# Patient Record
Sex: Male | Born: 1970 | Race: White | Hispanic: No | Marital: Married | Smoking: Never smoker
Health system: Southern US, Community
[De-identification: ages and names within clinical notes are randomized; demographics above are authoritative.]

## PROBLEM LIST (undated history)

## (undated) DIAGNOSIS — K649 Unspecified hemorrhoids: Secondary | ICD-10-CM

## (undated) DIAGNOSIS — N400 Enlarged prostate without lower urinary tract symptoms: Secondary | ICD-10-CM

## (undated) DIAGNOSIS — R3911 Hesitancy of micturition: Secondary | ICD-10-CM

## (undated) DIAGNOSIS — E291 Testicular hypofunction: Secondary | ICD-10-CM

## (undated) DIAGNOSIS — E785 Hyperlipidemia, unspecified: Secondary | ICD-10-CM

## (undated) DIAGNOSIS — N529 Male erectile dysfunction, unspecified: Secondary | ICD-10-CM

## (undated) HISTORY — DX: Benign prostatic hyperplasia without lower urinary tract symptoms: N40.0

## (undated) HISTORY — DX: Unspecified hemorrhoids: K64.9

## (undated) HISTORY — DX: Male erectile dysfunction, unspecified: N52.9

## (undated) HISTORY — DX: Testicular hypofunction: E29.1

## (undated) HISTORY — DX: Hyperlipidemia, unspecified: E78.5

## (undated) HISTORY — DX: Hesitancy of micturition: R39.11

---

## 1992-09-07 HISTORY — PX: SHOULDER SURGERY: SHX246

## 2003-09-08 HISTORY — PX: OTHER SURGICAL HISTORY: SHX169

## 2010-11-18 ENCOUNTER — Ambulatory Visit: Payer: Self-pay | Admitting: Internal Medicine

## 2012-02-16 ENCOUNTER — Ambulatory Visit: Payer: Self-pay | Admitting: Unknown Physician Specialty

## 2012-03-18 ENCOUNTER — Ambulatory Visit: Payer: Self-pay | Admitting: Unknown Physician Specialty

## 2015-03-12 HISTORY — PX: OTHER SURGICAL HISTORY: SHX169

## 2015-03-18 ENCOUNTER — Encounter: Payer: Self-pay | Admitting: *Deleted

## 2015-03-18 ENCOUNTER — Ambulatory Visit: Payer: Self-pay | Admitting: Urology

## 2015-03-25 ENCOUNTER — Ambulatory Visit: Payer: Self-pay | Admitting: Urology

## 2015-04-09 ENCOUNTER — Encounter: Payer: Self-pay | Admitting: Urology

## 2015-04-09 ENCOUNTER — Ambulatory Visit (INDEPENDENT_AMBULATORY_CARE_PROVIDER_SITE_OTHER): Payer: 59 | Admitting: Urology

## 2015-04-09 VITALS — BP 122/82 | HR 81 | Ht 71.0 in | Wt 213.6 lb

## 2015-04-09 DIAGNOSIS — N401 Enlarged prostate with lower urinary tract symptoms: Secondary | ICD-10-CM

## 2015-04-09 DIAGNOSIS — N528 Other male erectile dysfunction: Secondary | ICD-10-CM | POA: Diagnosis not present

## 2015-04-09 DIAGNOSIS — E291 Testicular hypofunction: Secondary | ICD-10-CM

## 2015-04-09 DIAGNOSIS — N138 Other obstructive and reflux uropathy: Secondary | ICD-10-CM

## 2015-04-09 LAB — BLADDER SCAN AMB NON-IMAGING: Scan Result: 103

## 2015-04-09 MED ORDER — TESTOSTERONE 30 MG/ACT TD SOLN: 2.0000 | Freq: Every day | TRANSDERMAL | Status: DC

## 2015-04-09 NOTE — Progress Notes (Signed)
04/09/2015 4:36 PM   Jonny Ruiz Jolyn Lent April 29, 1971 [id number]  Referring provider: No referring provider defined for this encounter.  Chief Complaint  Patient presents with  . Benign Prostatic Hypertrophy    6 month recheck  . Erectile Dysfunction    HPI: Mr. Pompey is a 44 year old white male with a history of hypogonadism, erectile dysfunction and BPH with LUTS.  BPH with LUTS His IPSS score today is 1, which is mild lower urinary tract symptomatology. He is pleased  with his quality life due to his urinary symptoms. His PVR is 103 mL.    He does not having any urinary problems at this time.  He denies any dysuria, hematuria or suprapubic pain.   Previous PSA's:     0.3 ng/mL on 08/16/2013     0.4 ng/mL on 02/27/2014     0.3 ng/mL on 09/17/2014  He also denies any recent fevers, chills, nausea or vomiting.  He has a family history of PCa, with maternal grandfather with prostate cancer.         IPSS      04/09/15 1600       International Prostate Symptom Score   How often have you had the sensation of not emptying your bladder? Not at All     How often have you had to urinate less than every two hours? Not at All     How often have you found you stopped and started again several times when you urinated? Not at All     How often have you found it difficult to postpone urination? Less than 1 in 5 times     How often have you had a weak urinary stream? Not at All     How often have you had to strain to start urination? Not at All     How many times did you typically get up at night to urinate? None     Total IPSS Score 1     Quality of Life due to urinary symptoms   If you were to spend the rest of your life with your urinary condition just the way it is now how would you feel about that? Pleased        Score:  1-7 Mild 8-19 Moderate 20-35 Severe  Erectile dysfunction: His SHIM score is 20, which is mild ED.   He has been having difficulty with erections for three  years.   His major complaint is maintaning his erections.  His libido is good.   His risk factors for ED are BPH.  He denies any painful erections or curvatures with his erections.   He has tried Cialis and Viagra with good results.       SHIM      04/09/15 1626       SHIM: Over the last 6 months:   How do you rate your confidence that you could get and keep an erection? Low     When you had erections with sexual stimulation, how often were your erections hard enough for penetration (entering your partner)? Most Times (much more than half the time)     During sexual intercourse, how often were you able to maintain your erection after you had penetrated (entered) your partner? Slightly Difficult     During sexual intercourse, how difficult was it to maintain your erection to completion of intercourse? Not Difficult     When you attempted sexual intercourse, how often was it satisfactory for you?  Not Difficult     SHIM Total Score   SHIM 20        Score: 1-7 Severe ED 8-11 Moderate ED 12-16 Mild-Moderate ED 17-21 Mild ED 22-25 No ED  Hypogonadism: Patient was diagnosed with hypogonadism in 2013. His serum testosterone level was 194 ng/dL on 16/06/9603.  He was started on Axiron at that time and has continue that medication with good results.  He has not had the Axiron medication for the last 2 weeks.  He had suffered a work related injury and had to reschedule his appointment. He would like to get back on the medication.      Androgen Deficiency in the Aging Male      04/09/15 1600       Androgen Deficiency in the Aging Male   Do you have a decrease in libido (sex drive) Yes     Do you have lack of energy Yes     Do you have a decrease in strength and/or endurance Yes     Have you lost height No     Have you noticed a decreased "enjoyment of life" No     Are you sad and/or grumpy No     Are your erections less strong Yes     Have you noticed a recent deterioration in your  ability to play sports No     Are you falling asleep after dinner No     Has there been a recent deterioration in your work performance Yes            PMH: Past Medical History  Diagnosis Date  . Erectile dysfunction   . HLD (hyperlipidemia)   . Hemorrhoids   . Hypogonadism in male   . Erectile dysfunction   . Benign enlargement of prostate   . Erectile dysfunction   . Urinary hesitancy     Surgical History: Past Surgical History  Procedure Laterality Date  . Leg and ankle fracture  2005  . Shoulder surgery Right 1994  . Bicep surgery Right 03/12/2015    Home Medications:    Medication List       This list is accurate as of: 04/09/15  4:36 PM.  Always use your most recent med list.               diazepam 5 MG tablet  Commonly known as:  VALIUM  Take 5 mg by mouth.     gabapentin 100 MG capsule  Commonly known as:  NEURONTIN  Take 100 mg by mouth.     oxyCODONE 5 MG immediate release tablet  Commonly known as:  Oxy IR/ROXICODONE  Take 5-10 mg by mouth.     PERCOCET 5-325 MG per tablet  Generic drug:  oxyCODONE-acetaminophen  1-2 tabs PO q6hr prn     sildenafil 100 MG tablet  Commonly known as:  VIAGRA  Take 100 mg by mouth as needed for erectile dysfunction.     Testosterone 30 MG/ACT Soln  Commonly known as:  AXIRON  Place 2 sprays onto the skin daily.     traMADol 50 MG tablet  Commonly known as:  ULTRAM  Take 50 mg by mouth.     ZOFRAN ODT 4 MG disintegrating tablet  Generic drug:  ondansetron  Take 4 mg by mouth.        Allergies:  Allergies  Allergen Reactions  . Hydrocodone-Acetaminophen Anxiety    "crazy"    Family History: Family History  Problem Relation Age of  Onset  . Prostate cancer Maternal Grandfather   . Diabetes Mellitus II    . Hypertension    . Kidney disease Neg Hx   . Prostate cancer      x6 Great uncles mom side    Social History:  reports that he has never smoked. He does not have any smokeless tobacco  history on file. He reports that he does not drink alcohol or use illicit drugs.  ROS: UROLOGY Frequent Urination?: No Hard to postpone urination?: No Burning/pain with urination?: No Get up at night to urinate?: No Leakage of urine?: No Urine stream starts and stops?: No Trouble starting stream?: No Do you have to strain to urinate?: No Blood in urine?: No Urinary tract infection?: No Sexually transmitted disease?: No Injury to kidneys or bladder?: No Painful intercourse?: No Weak stream?: No Erection problems?: No Penile pain?: No  Gastrointestinal Nausea?: No Vomiting?: No Indigestion/heartburn?: No Diarrhea?: No Constipation?: No  Constitutional Fever: No Night sweats?: No Weight loss?: No Fatigue?: No  Skin Skin rash/lesions?: No Itching?: No  Eyes Blurred vision?: No Double vision?: No  Ears/Nose/Throat Sore throat?: No Sinus problems?: No  Hematologic/Lymphatic Swollen glands?: No Easy bruising?: No  Cardiovascular Leg swelling?: No Chest pain?: No  Respiratory Cough?: No Shortness of breath?: No  Endocrine Excessive thirst?: No  Musculoskeletal Back pain?: No Joint pain?: No  Neurological Headaches?: No Dizziness?: No  Psychologic Depression?: No Anxiety?: No  Physical Exam: BP 122/82 mmHg  Pulse 81  Ht 5\' 11"  (1.803 m)  Wt 213 lb 9.6 oz (96.888 kg)  BMI 29.80 kg/m2  GU: Patient with circumcised phallus.  Urethral meatus is patent.  No penile discharge. No penile lesions or rashes. Scrotum without lesions, cysts, rashes and/or edema.  Testicles are located scrotally bilaterally. No masses are appreciated in the testicles. Left and right epididymis are normal. Rectal: Patient with  normal sphincter tone. Perineum without scarring or rashes. No rectal masses are appreciated. Prostate is approximately 45 grams, no nodules are appreciated. Seminal vesicles are normal.   Laboratory Data: Results for orders placed or performed in  visit on 04/09/15  PSA  Result Value Ref Range   Prostate Specific Ag, Serum 0.4 0.0 - 4.0 ng/mL  Hematocrit  Result Value Ref Range   Hematocrit 44.7 37.5 - 51.0 %  BLADDER SCAN AMB NON-IMAGING  Result Value Ref Range   Scan Result 103    No results found for: WBC, HGB, HCT, MCV, PLT  No results found for: CREATININE  No results found for: PSA  No results found for: TESTOSTERONE  No results found for: HGBA1C  Urinalysis No results found for: COLORURINE, APPEARANCEUR, LABSPEC, PHURINE, GLUCOSEU, HGBUR, BILIRUBINUR, KETONESUR, PROTEINUR, UROBILINOGEN, NITRITE, LEUKOCYTESUR  Pertinent Imaging:   Assessment & Plan:    1. BPH (benign prostatic hyperplasia) with LUTS:  Patient's IPSS score is 1/1.  His PVR 103.  His DRE demonstrates mild enlargement.  I discussed the treatment options for BPH, such as: observation over time with prn avoidance of alcohol/caffeine; medical treatment or TURP.  Patient like to continue observation.  He will follow up in 6 months for a PSA, DRE, PVR and an IPSS.    - PSA - BLADDER SCAN AMB NON-IMAGING  2. Hypogonadism in male:   Patient will return in the morning for his serum testosterone draw. A refill for the Axiron is sent to his pharmacy. He will follow-up in 6 months time for hematocrit, a serum testosterone draw before 9 AM and a  ADAM questionnaire  - Testosterone; Future - Hematocrit  3. Erectile dysfunction:  SHIM score is 20.  He states he is still using Viagra on a as needed basis.  He does not require a refill at this time.  He will follow up in six months for a SHIM.   Michiel Cowboy, PA-C  [facility] [address], [address] [place], [place] [address] [phone]

## 2015-04-10 ENCOUNTER — Other Ambulatory Visit: Payer: 59

## 2015-04-10 DIAGNOSIS — N138 Other obstructive and reflux uropathy: Secondary | ICD-10-CM | POA: Insufficient documentation

## 2015-04-10 DIAGNOSIS — E291 Testicular hypofunction: Secondary | ICD-10-CM

## 2015-04-10 DIAGNOSIS — N528 Other male erectile dysfunction: Secondary | ICD-10-CM | POA: Insufficient documentation

## 2015-04-10 DIAGNOSIS — N401 Enlarged prostate with lower urinary tract symptoms: Principal | ICD-10-CM

## 2015-04-10 LAB — PSA: Prostate Specific Ag, Serum: 0.4 ng/mL (ref 0.0–4.0)

## 2015-04-10 LAB — HEMATOCRIT: HEMATOCRIT: 44.7 % (ref 37.5–51.0)

## 2015-04-11 ENCOUNTER — Telehealth: Payer: Self-pay

## 2015-04-11 LAB — TESTOSTERONE: Testosterone: 295 ng/dL — ABNORMAL LOW (ref 348–1197)

## 2015-04-11 NOTE — Telephone Encounter (Signed)
Spoke with pt and made aware of lab results. Pt voiced understanding.  

## 2015-04-11 NOTE — Telephone Encounter (Signed)
-----   Message from Harle Battiest, PA-C sent at 04/11/2015 12:51 PM EDT ----- Patient's labs are good for the exception of his testosterone.  We expect that because he has been off his testosterone.

## 2015-10-10 ENCOUNTER — Ambulatory Visit: Payer: 59 | Admitting: Urology

## 2015-10-11 ENCOUNTER — Ambulatory Visit (INDEPENDENT_AMBULATORY_CARE_PROVIDER_SITE_OTHER): Payer: 59 | Admitting: Urology

## 2015-10-11 ENCOUNTER — Encounter: Payer: Self-pay | Admitting: Urology

## 2015-10-11 VITALS — BP 126/77 | HR 90 | Ht 71.0 in | Wt 208.3 lb

## 2015-10-11 DIAGNOSIS — N528 Other male erectile dysfunction: Secondary | ICD-10-CM

## 2015-10-11 DIAGNOSIS — E291 Testicular hypofunction: Secondary | ICD-10-CM | POA: Diagnosis not present

## 2015-10-11 DIAGNOSIS — N138 Other obstructive and reflux uropathy: Secondary | ICD-10-CM

## 2015-10-11 DIAGNOSIS — N401 Enlarged prostate with lower urinary tract symptoms: Secondary | ICD-10-CM | POA: Diagnosis not present

## 2015-10-11 MED ORDER — NATESTO 5.5 MG/ACT NA GEL: 1.0000 | Freq: Two times a day (BID) | NASAL | Status: DC

## 2015-10-11 NOTE — Progress Notes (Signed)
8:48 AM   Carla Drape Oct 27, 1970 [id number]  Referring provider: Marguarite Arbour, MD [address] [facility] [place], [place] [address]  Chief Complaint  Patient presents with  . Benign Prostatic Hypertrophy    6 month follow up  . Hypogonadism    HPI: [name] is a 45 year old white male with a history of hypogonadism, erectile dysfunction and BPH with LUTS for a 6 months follow up.   BPH with LUTS His IPSS score today is 0, which is no lower urinary tract symptomatology. He is delighted  with his quality life due to his urinary symptoms.  He does not having any urinary problems at this time.  He denies any dysuria, hematuria or suprapubic pain.  He also denies any recent fevers, chills, nausea or vomiting.  He has a family history of PCa, with maternal grandfather with prostate cancer.         IPSS      10/11/15 0800       International Prostate Symptom Score   How often have you had the sensation of not emptying your bladder? Not at All     How often have you had to urinate less than every two hours? Not at All     How often have you found you stopped and started again several times when you urinated? Not at All     How often have you found it difficult to postpone urination? Not at All     How often have you had a weak urinary stream? Not at All     How often have you had to strain to start urination? Not at All     How many times did you typically get up at night to urinate? None     Total IPSS Score 0     Quality of Life due to urinary symptoms   If you were to spend the rest of your life with your urinary condition just the way it is now how would you feel about that? Delighted        Score:  1-7 Mild 8-19 Moderate 20-35 Severe  Erectile dysfunction: His SHIM score is 22, which is no ED.   He has been having difficulty with erections for three years.   His major complaint is maintaining his erections.  His libido is good.   His risk factors  for ED are BPH.  He denies any painful erections or curvatures with his erections.   He has tried Cialis and Viagra with good results.       SHIM      10/11/15 0836       SHIM: Over the last 6 months:   How do you rate your confidence that you could get and keep an erection? High     When you had erections with sexual stimulation, how often were your erections hard enough for penetration (entering your partner)? Most Times (much more than half the time)     During sexual intercourse, how often were you able to maintain your erection after you had penetrated (entered) your partner? Slightly Difficult     During sexual intercourse, how difficult was it to maintain your erection to completion of intercourse? Not Difficult     When you attempted sexual intercourse, how often was it satisfactory for you? Not Difficult     SHIM Total Score   SHIM 22        Score: 1-7 Severe ED  8-11 Moderate ED 12-16 Mild-Moderate ED 17-21 Mild ED 22-25 No ED  Hypogonadism: Patient was diagnosed with hypogonadism in 2013. His serum testosterone level was 194 ng/dL on 16/06/9603.  He was started on Axiron at that time and has continue that medication with good results.  He feels well for the exception of his erections being less strong than when he was younger.       Androgen Deficiency in the Aging Male      10/11/15 0800       Androgen Deficiency in the Aging Male   Do you have a decrease in libido (sex drive) No     Do you have lack of energy No     Do you have a decrease in strength and/or endurance No     Have you lost height No     Have you noticed a decreased "enjoyment of life" No     Are you sad and/or grumpy No     Are your erections less strong Yes     Have you noticed a recent deterioration in your ability to play sports No     Are you falling asleep after dinner No     Has there been a recent deterioration in your work performance No            PMH: Past Medical History    Diagnosis Date  . Erectile dysfunction   . HLD (hyperlipidemia)   . Hemorrhoids   . Hypogonadism in male   . Erectile dysfunction   . Benign enlargement of prostate   . Erectile dysfunction   . Urinary hesitancy     Surgical History: Past Surgical History  Procedure Laterality Date  . Leg and ankle fracture  2005  . Shoulder surgery Right 1994  . Bicep surgery Right 03/12/2015    Home Medications:    Medication List       This list is accurate as of: 10/11/15  8:48 AM.  Always use your most recent med list.               gabapentin 100 MG capsule  Commonly known as:  NEURONTIN  Take 100 mg by mouth. Reported on 10/11/2015     ibuprofen 600 MG tablet  Commonly known as:  ADVIL,MOTRIN  Take 600 mg by mouth.     LYRICA 75 MG capsule  Generic drug:  pregabalin  Take 75 mg by mouth. Reported on 10/11/2015     oxyCODONE 5 MG immediate release tablet  Commonly known as:  Oxy IR/ROXICODONE  Take 5-10 mg by mouth. Reported on 10/11/2015     PERCOCET 5-325 MG tablet  Generic drug:  oxyCODONE-acetaminophen  Reported on 10/11/2015     sildenafil 100 MG tablet  Commonly known as:  VIAGRA  Take 100 mg by mouth as needed for erectile dysfunction. Reported on 10/11/2015     Testosterone 30 MG/ACT Soln  Commonly known as:  AXIRON  Place 2 sprays onto the skin daily.     traMADol 50 MG tablet  Commonly known as:  ULTRAM  Take 50 mg by mouth. Reported on 10/11/2015     ZOFRAN ODT 4 MG disintegrating tablet  Generic drug:  ondansetron  Take 4 mg by mouth. Reported on 10/11/2015        Allergies:  Allergies  Allergen Reactions  . Hydrocodone-Acetaminophen Anxiety    "crazy"    Family History: Family History  Problem Relation Age of Onset  . Prostate cancer Maternal  Grandfather   . Diabetes Mellitus II    . Hypertension    . Kidney disease Neg Hx   . Prostate cancer      x6 Great uncles mom side    Social History:  reports that he has never smoked. He does not have  any smokeless tobacco history on file. He reports that he does not drink alcohol or use illicit drugs.  ROS: UROLOGY Frequent Urination?: No Hard to postpone urination?: No Burning/pain with urination?: No Get up at night to urinate?: No Leakage of urine?: No Urine stream starts and stops?: No Trouble starting stream?: No Do you have to strain to urinate?: No Blood in urine?: No Urinary tract infection?: No Sexually transmitted disease?: No Injury to kidneys or bladder?: No Painful intercourse?: No Weak stream?: No Erection problems?: No Penile pain?: No  Gastrointestinal Nausea?: No Vomiting?: No Indigestion/heartburn?: No Diarrhea?: No Constipation?: No  Constitutional Fever: No Night sweats?: No Weight loss?: No Fatigue?: No  Skin Skin rash/lesions?: No Itching?: No  Eyes Blurred vision?: No Double vision?: No  Ears/Nose/Throat Sore throat?: No Sinus problems?: No  Hematologic/Lymphatic Swollen glands?: No Easy bruising?: No  Cardiovascular Leg swelling?: No Chest pain?: No  Respiratory Cough?: No Shortness of breath?: No  Endocrine Excessive thirst?: No  Musculoskeletal Back pain?: No Joint pain?: No  Neurological Headaches?: No Dizziness?: No  Psychologic Depression?: No Anxiety?: No  Physical Exam: BP 126/77 mmHg  Pulse 90  Ht  (1.803 m)  Wt 208 lb 4.8 oz (94.484 kg)  BMI 29.06 kg/m2  GU: Patient with circumcised phallus.  Urethral meatus is patent.  No penile discharge. No penile lesions or rashes. Scrotum without lesions, cysts, rashes and/or edema.  Testicles are located scrotally bilaterally. No masses are appreciated in the testicles. Left and right epididymis are normal. Rectal: Patient with  normal sphincter tone. Perineum without scarring or rashes. No rectal masses are appreciated. Prostate is approximately 45 grams, no nodules are appreciated. Seminal vesicles are normal.   Laboratory Data: Results for orders  placed or performed in visit on 04/10/15  Testosterone  Result Value Ref Range   Testosterone 295 (L) 348 - 1197 ng/dL   Comment, Testosterone Comment    Lab Results  Component Value Date   HCT 44.7 04/09/2015   Previous PSA's:     0.3 ng/mL on 08/16/2013     0.4 ng/mL on 02/27/2014     0.3 ng/mL on 09/17/2014  Lab Results  Component Value Date   TESTOSTERONE 295* 04/10/2015    Pertinent Imaging:   Assessment & Plan:    1. BPH (benign prostatic hyperplasia) with LUTS:  Patient's IPSS score is 0/0.   He will follow up in 6 months for a PSA, exam and an IPSS score.    - PSA   2. Hypogonadism in male:   Patient is having good results with the Axiron, but he is wanting to know if there is another option besides gels.  I suggested Natesto.  He would like to see if his insurance would cover that medication.  I have given him a script.  He will RTC in 3 months for a HCT and testosterone level.    - Testosterone; Future - Hematocrit -TSH -Hepatic panel -lipid panel  3. Erectile dysfunction:  SHIM score is 22.  He states he is still using Viagra on a as needed basis.  He does not require a refill at this time.  He will follow up in six months  for a SHIM and exam.    Michiel Cowboy, [facility] [address] [place], [place] [address] [phone]

## 2015-10-12 LAB — HEPATIC FUNCTION PANEL
ALT: 70 IU/L — AB (ref 0–44)
AST: 35 IU/L (ref 0–40)
Albumin: 4.4 g/dL (ref 3.5–5.5)
Alkaline Phosphatase: 53 IU/L (ref 39–117)
Bilirubin Total: 1.3 mg/dL — ABNORMAL HIGH (ref 0.0–1.2)
Bilirubin, Direct: 0.24 mg/dL (ref 0.00–0.40)
Total Protein: 6.6 g/dL (ref 6.0–8.5)

## 2015-10-12 LAB — TSH: TSH: 1.25 u[IU]/mL (ref 0.450–4.500)

## 2015-10-12 LAB — LIPID PANEL
CHOLESTEROL TOTAL: 215 mg/dL — AB (ref 100–199)
Chol/HDL Ratio: 6.5 ratio units — ABNORMAL HIGH (ref 0.0–5.0)
HDL: 33 mg/dL — AB (ref >39)
LDL Calculated: 131 mg/dL — ABNORMAL HIGH (ref 0–99)
TRIGLYCERIDES: 254 mg/dL — AB (ref 0–149)
VLDL CHOLESTEROL CAL: 51 mg/dL — AB (ref 5–40)

## 2015-10-12 LAB — ESTRADIOL: ESTRADIOL: 25.6 pg/mL (ref 7.6–42.6)

## 2015-10-12 LAB — TESTOSTERONE: Testosterone: 688 ng/dL (ref 348–1197)

## 2015-10-12 LAB — HEMATOCRIT: Hematocrit: 46.4 % (ref 37.5–51.0)

## 2015-10-12 LAB — PSA: PROSTATE SPECIFIC AG, SERUM: 0.5 ng/mL (ref 0.0–4.0)

## 2015-10-14 ENCOUNTER — Telehealth: Payer: Self-pay

## 2015-10-14 NOTE — Telephone Encounter (Signed)
Spoke with pt in reference to abnormal labs. Made aware Carollee Herter suggest he see his PCP. Pt voiced understanding.

## 2015-10-14 NOTE — Telephone Encounter (Signed)
-----   Message from Harle Battiest, PA-C sent at 10/12/2015  7:44 PM EST ----- Patient's liver enzymes and lipids are elevated.  I suggest stopping his Axiron and seeing his PCP to get these abnormal labs addressed.

## 2015-10-18 ENCOUNTER — Other Ambulatory Visit: Payer: Self-pay | Admitting: Internal Medicine

## 2015-10-18 DIAGNOSIS — R945 Abnormal results of liver function studies: Secondary | ICD-10-CM

## 2015-10-18 DIAGNOSIS — R7989 Other specified abnormal findings of blood chemistry: Secondary | ICD-10-CM

## 2015-10-25 ENCOUNTER — Ambulatory Visit
Admission: RE | Admit: 2015-10-25 | Discharge: 2015-10-25 | Disposition: A | Discharge status: Home / Self Care | Payer: Managed Care, Other (non HMO) | Source: Ambulatory Visit | Attending: Internal Medicine | Admitting: Internal Medicine

## 2015-10-25 DIAGNOSIS — K760 Fatty (change of) liver, not elsewhere classified: Secondary | ICD-10-CM | POA: Insufficient documentation

## 2015-10-25 DIAGNOSIS — R7989 Other specified abnormal findings of blood chemistry: Secondary | ICD-10-CM | POA: Diagnosis not present

## 2015-10-25 DIAGNOSIS — R945 Abnormal results of liver function studies: Secondary | ICD-10-CM

## 2015-12-23 ENCOUNTER — Other Ambulatory Visit: Payer: Self-pay | Admitting: Specialist

## 2015-12-23 ENCOUNTER — Ambulatory Visit
Admission: RE | Admit: 2015-12-23 | Discharge: 2015-12-23 | Disposition: A | Discharge status: Home / Self Care | Payer: Managed Care, Other (non HMO) | Source: Ambulatory Visit | Attending: Specialist | Admitting: Specialist

## 2015-12-23 ENCOUNTER — Ambulatory Visit
Admission: RE | Admit: 2015-12-23 | Discharge: 2015-12-23 | Disposition: A | Discharge status: Home / Self Care | Payer: Worker's Compensation | Source: Ambulatory Visit | Attending: Specialist | Admitting: Specialist

## 2015-12-23 DIAGNOSIS — Z1389 Encounter for screening for other disorder: Secondary | ICD-10-CM | POA: Diagnosis present

## 2015-12-23 DIAGNOSIS — Z0189 Encounter for other specified special examinations: Secondary | ICD-10-CM

## 2016-01-10 ENCOUNTER — Other Ambulatory Visit: Payer: 59

## 2016-06-15 ENCOUNTER — Other Ambulatory Visit: Payer: Managed Care, Other (non HMO)

## 2016-06-15 DIAGNOSIS — N401 Enlarged prostate with lower urinary tract symptoms: Principal | ICD-10-CM

## 2016-06-15 DIAGNOSIS — N138 Other obstructive and reflux uropathy: Secondary | ICD-10-CM

## 2016-06-16 LAB — PSA: Prostate Specific Ag, Serum: 0.3 ng/mL (ref 0.0–4.0)

## 2016-06-17 ENCOUNTER — Telehealth: Payer: Self-pay

## 2016-06-17 NOTE — Telephone Encounter (Signed)
-----   Message from Harle BattiestShannon A McGowan, PA-C sent at 06/17/2016  8:44 AM EDT ----- Patient needs to have an office visit.  He needs to have a testosterone and a HCT added to his blood work.

## 2016-06-17 NOTE — Telephone Encounter (Signed)
Spoke with pt in reference to needing an OV for lab results. Pt voiced understanding. Pt was transferred to the front to make appt.  Only testosterone labs could be added due to blood available. WILL NEED HCT AT APPT.

## 2016-06-22 ENCOUNTER — Ambulatory Visit (INDEPENDENT_AMBULATORY_CARE_PROVIDER_SITE_OTHER): Payer: Managed Care, Other (non HMO) | Admitting: Urology

## 2016-06-22 ENCOUNTER — Encounter: Payer: Self-pay | Admitting: Urology

## 2016-06-22 VITALS — BP 136/92 | HR 102 | Ht 71.0 in | Wt 221.7 lb

## 2016-06-22 DIAGNOSIS — N138 Other obstructive and reflux uropathy: Secondary | ICD-10-CM | POA: Diagnosis not present

## 2016-06-22 DIAGNOSIS — N529 Male erectile dysfunction, unspecified: Secondary | ICD-10-CM | POA: Diagnosis not present

## 2016-06-22 DIAGNOSIS — N401 Enlarged prostate with lower urinary tract symptoms: Secondary | ICD-10-CM | POA: Diagnosis not present

## 2016-06-22 DIAGNOSIS — E291 Testicular hypofunction: Secondary | ICD-10-CM | POA: Diagnosis not present

## 2016-06-22 NOTE — Progress Notes (Signed)
4:37 PM   Corey Henderson 1971/04/25 [id number]  Referring provider: Marguarite Arbour, MD [address] [facility] [place], [place] [address]  Chief Complaint  Patient presents with  . Results    go over lab results    HPI: Corey Henderson is a 45 year old Caucasian male with a history of hypogonadism, erectile dysfunction and BPH with LUTS for a 6 months follow up.   BPH with LUTS His IPSS score today is 7, which is mild lower urinary tract symptomatology. He is mostly satisfied with his quality life due to his urinary symptoms.  His IPSS score was 0/0.  He does not having any urinary problems at this time.  He denies any dysuria, hematuria or suprapubic pain.  He also denies any recent fevers, chills, nausea or vomiting.  He has a family history of PCa, with maternal grandfather with prostate cancer.        IPSS    Row Name 06/22/16 1600         International Prostate Symptom Score   How often have you had the sensation of not emptying your bladder? Almost always     How often have you had to urinate less than every two hours? Not at All     How often have you found you stopped and started again several times when you urinated? Not at All     How often have you found it difficult to postpone urination? Not at All     How often have you had a weak urinary stream? Not at All     How often have you had to strain to start urination? Not at All     How many times did you typically get up at night to urinate? 2 Times     Total IPSS Score 7       Quality of Life due to urinary symptoms   If you were to spend the rest of your life with your urinary condition just the way it is now how would you feel about that? Mostly Satisfied        Score:  1-7 Mild 8-19 Moderate 20-35 Severe  Erectile dysfunction: His SHIM score is 21, which is no ED.   His previous SHIM score was 22.  He has been having difficulty with erections for three years.   His major complaint is  maintaining his erections.  His libido is good.   His risk factors for ED are BPH.  He denies any painful erections or curvatures with his erections.        SHIM    Row Name 06/22/16 1625         SHIM: Over the last 6 months:   How do you rate your confidence that you could get and keep an erection? Moderate     When you had erections with sexual stimulation, how often were your erections hard enough for penetration (entering your partner)? Most Times (much more than half the time)     During sexual intercourse, how often were you able to maintain your erection after you had penetrated (entered) your partner? Slightly Difficult     During sexual intercourse, how difficult was it to maintain your erection to completion of intercourse? Not Difficult     When you attempted sexual intercourse, how often was it satisfactory for you? Not Difficult       SHIM Total Score   SHIM 21  Score: 1-7 Severe ED 8-11 Moderate ED 12-16 Mild-Moderate ED 17-21 Mild ED 22-25 No ED  Hypogonadism Patient is experiencing his erections being less strong.  This is indicated by his responses to the ADAM questionnaire.  He is no longer having spontaneous erections at night.    He does not have sleep apnea.  He was managing his hypogonadism with Axiron, but he has no longer applied this medication for several months.  He has not noticed any difference.  His current serum testosterone was 385.8 ng/dL on 13/24/401010/05/2016.     Androgen Deficiency in the Aging Male    Row Name 06/22/16 1600         Androgen Deficiency in the Aging Male   Do you have a decrease in libido (sex drive) No     Do you have lack of energy No     Do you have a decrease in strength and/or endurance No     Have you lost height No     Have you noticed a decreased "enjoyment of life" No     Are you sad and/or grumpy No     Are your erections less strong Yes     Have you noticed a recent deterioration in your ability to play sports No      Are you falling asleep after dinner No     Has there been a recent deterioration in your work performance No         PMH: Past Medical History:  Diagnosis Date  . Benign enlargement of prostate   . Erectile dysfunction   . Erectile dysfunction   . Erectile dysfunction   . Hemorrhoids   . HLD (hyperlipidemia)   . Hypogonadism in male   . Urinary hesitancy     Surgical History: Past Surgical History:  Procedure Laterality Date  . Bicep surgery Right 03/12/2015  . bicep surgery Left    11/2015  . leg and ankle fracture  2005  . SHOULDER SURGERY Right 1994    Home Medications:    Medication List       Accurate as of 06/22/16  4:37 PM. Always use your most recent med list.          gabapentin 100 MG capsule Commonly known as:  NEURONTIN Take 100 mg by mouth. Reported on 10/11/2015   ibuprofen 600 MG tablet Commonly known as:  ADVIL,MOTRIN Take 600 mg by mouth.   oxyCODONE 5 MG immediate release tablet Commonly known as:  Oxy IR/ROXICODONE Take 5-10 mg by mouth. Reported on 10/11/2015   PERCOCET 5-325 MG tablet Generic drug:  oxyCODONE-acetaminophen Reported on 10/11/2015   pregabalin 75 MG capsule Commonly known as:  LYRICA Take 75 mg by mouth.   sildenafil 100 MG tablet Commonly known as:  VIAGRA Take 100 mg by mouth.   Testosterone 30 MG/ACT Soln Commonly known as:  AXIRON Place 2 sprays onto the skin daily.   NATESTO 5.5 MG/ACT Gel Generic drug:  Testosterone Place 1 spray into the nose 2 (two) times daily. Place 1 spray into each nostril twice daily   traMADol 50 MG tablet Commonly known as:  ULTRAM Take 50 mg by mouth. Reported on 10/11/2015   ZOFRAN ODT 4 MG disintegrating tablet Generic drug:  ondansetron Take 4 mg by mouth. Reported on 10/11/2015       Allergies:  Allergies  Allergen Reactions  . Hydrocodone-Acetaminophen Anxiety    "crazy"    Family History: Family History  Problem Relation Age of  Onset  . Prostate cancer  Maternal Grandfather   . Diabetes Mellitus II    . Hypertension    . Prostate cancer      x6 Great uncles mom side  . Kidney disease Neg Hx     Social History:  reports that he has never smoked. He has quit using smokeless tobacco. He reports that he does not drink alcohol or use drugs.  ROS: UROLOGY Frequent Urination?: No Hard to postpone urination?: No Burning/pain with urination?: No Get up at night to urinate?: No Leakage of urine?: No Urine stream starts and stops?: No Trouble starting stream?: No Do you have to strain to urinate?: No Blood in urine?: No Urinary tract infection?: No Sexually transmitted disease?: No Injury to kidneys or bladder?: No Painful intercourse?: No Weak stream?: No Erection problems?: No Penile pain?: No  Gastrointestinal Nausea?: No Vomiting?: No Indigestion/heartburn?: No Diarrhea?: No Constipation?: No  Constitutional Fever: No Night sweats?: No Weight loss?: No Fatigue?: No  Skin Skin rash/lesions?: No Itching?: No  Eyes Blurred vision?: No Double vision?: No  Ears/Nose/Throat Sore throat?: No Sinus problems?: No  Hematologic/Lymphatic Swollen glands?: No Easy bruising?: No  Cardiovascular Leg swelling?: No Chest pain?: No  Respiratory Cough?: No Shortness of breath?: No  Endocrine Excessive thirst?: No  Musculoskeletal Back pain?: No Joint pain?: No  Neurological Headaches?: No Dizziness?: No  Psychologic Depression?: No Anxiety?: No  Physical Exam: BP (!) 136/92   Pulse (!) 102   Ht 5\' 11"  (1.803 m)   Wt 221 lb 11.2 oz (100.6 kg)   BMI 30.92 kg/m   GU: Patient with circumcised phallus.  Urethral meatus is patent.  No penile discharge. No penile lesions or rashes. Scrotum without lesions, cysts, rashes and/or edema.  Testicles are located scrotally bilaterally. No masses are appreciated in the testicles. Left and right epididymis are normal. Rectal: Patient with  normal sphincter tone.  Perineum without scarring or rashes. No rectal masses are appreciated. Prostate is approximately 45 grams, no nodules are appreciated. Seminal vesicles are normal.   Laboratory Data: Results for orders placed or performed in visit on 06/15/16  PSA  Result Value Ref Range   Prostate Specific Ag, Serum 0.3 0.0 - 4.0 ng/mL  Testosterone, Total, LC/MS  Result Value Ref Range   Testosterone, total 385.8 264.0 - 916.0 ng/dL  Specimen status report  Result Value Ref Range   specimen status report Comment    Lab Results  Component Value Date   HCT 46.4 10/11/2015   Previous PSA's:     0.3 ng/mL on 08/16/2013     0.4 ng/mL on 02/27/2014     0.3 ng/mL on 09/17/2014  Lab Results  Component Value Date   TESTOSTERONE 385.8 06/15/2016    Assessment & Plan:    1. BPH with LUTS  - IPSS score is 7/2, it is worsening  - Continue conservative management, avoiding bladder irritants and timed voiding's  - RTC in 12 months for IPSS, PSA and exam   2. Hypogonadism in male  - Patient's testosterone level was 385.8 ng/dL on 45/40/9811  - He would like it rechecked again in one year  3. Erectile dysfunction:  SHIM score is 21.  He will follow up in 12 months for a SHIM and exam.    Michiel Cowboy, [facility] [address] [place], [place] [address] [phone]

## 2016-06-24 LAB — TESTOSTERONE, TOTAL, LC/MS: Testosterone, total: 385.8 ng/dL (ref 264.0–916.0)

## 2016-06-24 LAB — SPECIMEN STATUS REPORT

## 2016-08-11 ENCOUNTER — Other Ambulatory Visit: Payer: Self-pay | Admitting: Internal Medicine

## 2016-08-11 DIAGNOSIS — R1084 Generalized abdominal pain: Secondary | ICD-10-CM

## 2016-08-24 ENCOUNTER — Ambulatory Visit
Admission: RE | Admit: 2016-08-24 | Discharge: 2016-08-24 | Disposition: A | Discharge status: Home / Self Care | Payer: Managed Care, Other (non HMO) | Source: Ambulatory Visit | Attending: Internal Medicine | Admitting: Internal Medicine

## 2016-08-24 DIAGNOSIS — R1084 Generalized abdominal pain: Secondary | ICD-10-CM | POA: Insufficient documentation

## 2016-08-24 MED ORDER — IOPAMIDOL (ISOVUE-370) INJECTION 76%
100.0000 mL | Freq: Once | INTRAVENOUS | Status: AC | PRN
  Administered 2016-08-24: 100 mL via INTRAVENOUS

## 2017-01-04 ENCOUNTER — Ambulatory Visit: Payer: Managed Care, Other (non HMO) | Admitting: Urology

## 2017-01-07 ENCOUNTER — Other Ambulatory Visit: Payer: Self-pay

## 2017-01-12 NOTE — Progress Notes (Signed)
3:50 PM   Corey Henderson Jan 06, 1971 [id number]  Referring provider: Marguarite Arbour, MD [address] [facility] [place], [place] [address]  Chief Complaint  Patient presents with  . Urinary Frequency    and low T sent over by PCP  last seen 06/2016    HPI: [name] is a 46 year old Caucasian male with a history of testosterone deficiency, erectile dysfunction and BPH with LUTS who presents today for re evaluation.    Testosterone deficiency Patient is experiencing a lack of energy, a decrease in strength, erections being less strong and a recent deterioration in their work performance.  This is indicated by his responses to the ADAM questionnaire.  He is no longer having spontaneous erections at night.  He does not have sleep apnea.   He has an elevated BMI.       Androgen Deficiency in the Aging Male    Row Name 01/13/17 1500         Androgen Deficiency in the Aging Male   Do you have a decrease in libido (sex drive) No     Do you have lack of energy Yes     Do you have a decrease in strength and/or endurance Yes     Have you lost height No     Have you noticed a decreased "enjoyment of life" No     Are you sad and/or grumpy No     Are your erections less strong Yes     Have you noticed a recent deterioration in your ability to play sports No     Are you falling asleep after dinner No     Has there been a recent deterioration in your work performance Yes          BPH with LUTS His IPSS score today is 0, which is no lower urinary tract symptomatology.  He is pleased with his quality life due to his urinary symptoms.   His IPSS score was 7/2.   He does not having any urinary problems at this time.  He denies any dysuria, hematuria or suprapubic pain.  He also denies any recent fevers, chills, nausea or vomiting.  He has a family history of PCa, with maternal grandfather with prostate cancer.        IPSS    Row Name 01/13/17 1500         International Prostate Symptom Score   How often have you had the sensation of not emptying your bladder? Not at All     How often have you had to urinate less than every two hours? Not at All     How often have you found you stopped and started again several times when you urinated? Not at All     How often have you found it difficult to postpone urination? Not at All     How often have you had a weak urinary stream? Not at All     How often have you had to strain to start urination? Not at All     How many times did you typically get up at night to urinate? None     Total IPSS Score 0       Quality of Life due to urinary symptoms   If you were to spend the rest of your life with your urinary condition just the way it is now how would you feel about that? Pleased  Score:  1-7 Mild 8-19 Moderate 20-35 Severe  Erectile dysfunction: His SHIM score is 23, which is no ED.   His previous SHIM score was 21.  He has been having difficulty with erections for four years.   His major complaint is maintaining his erections.  His libido is good.   His risk factors for ED are BPH.  He denies any painful erections or curvatures with his erections.        SHIM    Row Name 01/13/17 1536         SHIM: Over the last 6 months:   How do you rate your confidence that you could get and keep an erection? Moderate     When you had erections with sexual stimulation, how often were your erections hard enough for penetration (entering your partner)? Almost Always or Always     During sexual intercourse, how often were you able to maintain your erection after you had penetrated (entered) your partner? Almost Always or Always     During sexual intercourse, how difficult was it to maintain your erection to completion of intercourse? Not Difficult     When you attempted sexual intercourse, how often was it satisfactory for you? Almost Always or Always       SHIM Total Score   SHIM 23         Score: 1-7 Severe ED 8-11 Moderate ED 12-16 Mild-Moderate ED 17-21 Mild ED 22-25 No ED      Androgen Deficiency in the Aging Male    Row Name 01/13/17 1500         Androgen Deficiency in the Aging Male   Do you have a decrease in libido (sex drive) No     Do you have lack of energy Yes     Do you have a decrease in strength and/or endurance Yes     Have you lost height No     Have you noticed a decreased "enjoyment of life" No     Are you sad and/or grumpy No     Are your erections less strong Yes     Have you noticed a recent deterioration in your ability to play sports No     Are you falling asleep after dinner No     Has there been a recent deterioration in your work performance Yes         PMH: Past Medical History:  Diagnosis Date  . Benign enlargement of prostate   . Erectile dysfunction   . Erectile dysfunction   . Erectile dysfunction   . Hemorrhoids   . HLD (hyperlipidemia)   . Hypogonadism in male   . Urinary hesitancy     Surgical History: Past Surgical History:  Procedure Laterality Date  . Bicep surgery Right 03/12/2015  . bicep surgery Left    11/2015  . leg and ankle fracture  2005  . SHOULDER SURGERY Right 1994    Home Medications:  Allergies as of 01/13/2017      Reactions   Hydrocodone-acetaminophen Anxiety   "crazy"      Medication List       Accurate as of 01/13/17  3:50 PM. Always use your most recent med list.          albuterol 108 (90 Base) MCG/ACT inhaler Commonly known as:  PROVENTIL HFA;VENTOLIN HFA Inhale into the lungs.   gabapentin 100 MG capsule Commonly known as:  NEURONTIN Take 100 mg by mouth. Reported on 10/11/2015  ibuprofen 600 MG tablet Commonly known as:  ADVIL,MOTRIN Take 600 mg by mouth.   oxyCODONE 5 MG immediate release tablet Commonly known as:  Oxy IR/ROXICODONE Take 5-10 mg by mouth. Reported on 10/11/2015   PERCOCET 5-325 MG tablet Generic drug:  oxyCODONE-acetaminophen Reported on  10/11/2015   pregabalin 75 MG capsule Commonly known as:  LYRICA Take 75 mg by mouth.   sildenafil 100 MG tablet Commonly known as:  VIAGRA Take 100 mg by mouth.   Testosterone 30 MG/ACT Soln Commonly known as:  AXIRON Place 2 sprays onto the skin daily.   NATESTO 5.5 MG/ACT Gel Generic drug:  Testosterone Place 1 spray into the nose 2 (two) times daily. Place 1 spray into each nostril twice daily   traMADol 50 MG tablet Commonly known as:  ULTRAM Take 50 mg by mouth. Reported on 10/11/2015   ZOFRAN ODT 4 MG disintegrating tablet Generic drug:  ondansetron Take 4 mg by mouth. Reported on 10/11/2015       Allergies:  Allergies  Allergen Reactions  . Hydrocodone-Acetaminophen Anxiety    "crazy"    Family History: Family History  Problem Relation Age of Onset  . Prostate cancer Maternal Grandfather   . Diabetes Mellitus II    . Hypertension    . Prostate cancer      x6 Great uncles mom side  . Kidney disease Neg Hx     Social History:  reports that he has never smoked. He has quit using smokeless tobacco. He reports that he does not drink alcohol or use drugs.  ROS: UROLOGY Frequent Urination?: No Hard to postpone urination?: No Burning/pain with urination?: No Get up at night to urinate?: No Leakage of urine?: No Urine stream starts and stops?: No Trouble starting stream?: No Do you have to strain to urinate?: No Blood in urine?: No Urinary tract infection?: No Sexually transmitted disease?: No Injury to kidneys or bladder?: No Painful intercourse?: No Weak stream?: No Erection problems?: No Penile pain?: No  Gastrointestinal Nausea?: No Vomiting?: No Indigestion/heartburn?: No Diarrhea?: No Constipation?: No  Constitutional Fever: No Night sweats?: No Weight loss?: No Fatigue?: No  Skin Skin rash/lesions?: No Itching?: No  Eyes Blurred vision?: No Double vision?: No  Ears/Nose/Throat Sore throat?: No Sinus problems?:  No  Hematologic/Lymphatic Swollen glands?: No Easy bruising?: No  Cardiovascular Leg swelling?: No Chest pain?: No  Respiratory Cough?: No Shortness of breath?: No  Endocrine Excessive thirst?: No  Musculoskeletal Back pain?: No Joint pain?: No  Neurological Headaches?: No Dizziness?: No  Psychologic Depression?: No Anxiety?: No  Physical Exam: BP (!) 149/88   Pulse 96   Ht 5\' 11"  (1.803 m)   Wt 222 lb 8 oz (100.9 kg)   BMI 31.03 kg/m   GU: Patient with circumcised phallus.  Urethral meatus is patent.  No penile discharge. No penile lesions or rashes. Scrotum without lesions, cysts, rashes and/or edema.  Testicles are located scrotally bilaterally. No masses are appreciated in the testicles. Left and right epididymis are normal. Rectal: Patient with  normal sphincter tone. Perineum without scarring or rashes. No rectal masses are appreciated. Prostate is approximately 45 grams, no nodules are appreciated. Seminal vesicles are normal.   Laboratory Data: Results for orders placed or performed in visit on 06/15/16  PSA  Result Value Ref Range   Prostate Specific Ag, Serum 0.3 0.0 - 4.0 ng/mL  Testosterone, Total, LC/MS  Result Value Ref Range   Testosterone, total 385.8 264.0 - 916.0 ng/dL  Specimen  status report  Result Value Ref Range   specimen status report Comment    Lab Results  Component Value Date   HCT 46.4 10/11/2015   Previous PSA's:     0.3 ng/mL on 08/16/2013     0.4 ng/mL on 02/27/2014     0.3 ng/mL on 09/17/2014  Lab Results  Component Value Date   TESTOSTERONE 385.8 06/15/2016    Assessment & Plan:    1. Testosterone deficiency  - I explained to patient that the current recommendations from the Endocrine Society reports the diagnosis of hypogonadism requires a serum total testosterone level obtained between 8 and 10 AM at least 2 days apart that is below the laboratory parameters  for normal testosterone.     - At this time, the  patient does not meet this requirement.  He will return for two morning serum testosterones, two days apart before 10 AM   -I discussed with the patient the side effects of testosterone therapy, such as: enlargement of the prostate gland that may in turn cause LUTS, possible increased risk of PCa, DVT's and/or PE's, possible increased risk of heart attack or stroke, lower sperm count, swelling of the ankles, feet, or body, with or without heart failure, enlarged or painful breasts, have problems breathing while you sleep (sleep apnea), increased prostate specific antigen, mood swings, hypertension and increased red blood cell count.  - I also discussed that some men have had success using clomid for hypogonadism.  It does seem to be more successful in younger men, but there are incidences of good results in middle-aged men.  I explained that it is used in male infertility to stimulate the testicles to make more testosterone/sperm.  There has been no long term data on side effects, but some urologists has been having success with this medication.   2. BPH with LUTS  - IPSS score is 0/1, it is improving  - Continue conservative management, avoiding bladder irritants and timed voiding's  - RTC in am for labs  3. Erectile dysfunction:  SHIM score is 23.  He will follow up in 12 months for a SHIM and exam.    Michiel Cowboy, [facility] [address], [address] [place], [place] [address] [phone]

## 2017-01-13 ENCOUNTER — Ambulatory Visit (INDEPENDENT_AMBULATORY_CARE_PROVIDER_SITE_OTHER): Payer: Managed Care, Other (non HMO) | Admitting: Urology

## 2017-01-13 ENCOUNTER — Encounter: Payer: Self-pay | Admitting: Urology

## 2017-01-13 VITALS — BP 149/88 | HR 96 | Ht 71.0 in | Wt 222.5 lb

## 2017-01-13 DIAGNOSIS — N138 Other obstructive and reflux uropathy: Secondary | ICD-10-CM | POA: Diagnosis not present

## 2017-01-13 DIAGNOSIS — N401 Enlarged prostate with lower urinary tract symptoms: Secondary | ICD-10-CM

## 2017-01-13 DIAGNOSIS — E349 Endocrine disorder, unspecified: Secondary | ICD-10-CM

## 2017-01-13 DIAGNOSIS — N529 Male erectile dysfunction, unspecified: Secondary | ICD-10-CM | POA: Diagnosis not present

## 2017-01-15 ENCOUNTER — Other Ambulatory Visit: Payer: Self-pay

## 2017-01-15 ENCOUNTER — Other Ambulatory Visit: Payer: Managed Care, Other (non HMO)

## 2017-01-15 DIAGNOSIS — E291 Testicular hypofunction: Secondary | ICD-10-CM

## 2017-01-16 LAB — TESTOSTERONE: TESTOSTERONE: 268 ng/dL (ref 264–916)

## 2017-01-18 ENCOUNTER — Other Ambulatory Visit: Payer: Self-pay

## 2017-01-18 ENCOUNTER — Telehealth: Payer: Self-pay

## 2017-01-18 ENCOUNTER — Other Ambulatory Visit: Payer: Managed Care, Other (non HMO)

## 2017-01-18 DIAGNOSIS — E291 Testicular hypofunction: Secondary | ICD-10-CM

## 2017-01-18 NOTE — Telephone Encounter (Signed)
Spoke with pt in reference to testosterone results. Pt stated that he came in today for labs. Reinforced with pt will call back with results. Pt voiced understanding.

## 2017-01-18 NOTE — Telephone Encounter (Signed)
-----   Message from Harle BattiestShannon A McGowan, PA-C sent at 01/16/2017  7:34 PM EDT ----- Please let the patient know that his testosterone level has returned low normal. We will need to repeat the testosterone again before 9 AM.

## 2017-01-19 ENCOUNTER — Telehealth: Payer: Self-pay

## 2017-01-19 LAB — TESTOSTERONE: Testosterone: 306 ng/dL (ref 264–916)

## 2017-01-19 NOTE — Telephone Encounter (Signed)
-----   Message from Harle BattiestShannon A McGowan, PA-C sent at 01/19/2017  7:27 AM EDT ----- His second testosterone level returned above 300. This does not qualify him for testosterone deficiency or testosterone replacement therapy at this time.  I suggest lifestyle changes at this time, such as 40 minutes of moderate to vigorous exercise 4 days weekly. Also adding 5 servings of fruits and vegetables to his diet. If he still feels poorly, we can recheck another morning testosterone before 9 AM in 3 months.

## 2017-01-19 NOTE — Telephone Encounter (Signed)
Spoke with pt in reference to lab results and Shannon's recommendations of life style changes. Pt became very angry stating he has a very active job and there is no way he can exercise when he's exhausted by 4:00pm. Reinforced with pt to at least try added fruits and vegetables to his diet. Pt stated "doesnt look like you can help me either. I know something is wrong. This aint me. Thank you though." and hung up.

## 2017-06-15 ENCOUNTER — Other Ambulatory Visit: Payer: Managed Care, Other (non HMO)

## 2017-06-16 ENCOUNTER — Other Ambulatory Visit: Payer: Self-pay

## 2017-06-16 ENCOUNTER — Other Ambulatory Visit: Payer: Managed Care, Other (non HMO)

## 2017-06-16 DIAGNOSIS — E291 Testicular hypofunction: Secondary | ICD-10-CM

## 2017-06-17 LAB — TESTOSTERONE: Testosterone: 441 ng/dL (ref 264–916)

## 2017-06-21 ENCOUNTER — Telehealth: Payer: Self-pay

## 2017-06-21 NOTE — Telephone Encounter (Signed)
-----   Message from Harle Battiest, PA-C sent at 06/17/2017  7:42 AM EDT ----- Please let Corey Henderson know that his testosterone is over 400.  He needs to see his PCP again for further work up of his fatigue.

## 2017-06-21 NOTE — Progress Notes (Deleted)
1:24 PM   Corey Henderson Jan 20, 1971 [id number]  Referring provider: Marguarite Arbour, MD [address] [facility] [place], [place] [address]  No chief complaint on file.   HPI: Corey Henderson is a 46 year old Caucasian male with erectile dysfunction and BPH with LUTS who presents today for a one year follow up.    BPH with LUTS His IPSS score today is ***, which is *** lower urinary tract symptomatology.  He is *** with his quality life due to his urinary symptoms.   His IPSS score was 0/1.  He does not having any urinary problems at this time.  He denies any dysuria, hematuria or suprapubic pain.  He also denies any recent fevers, chills, nausea or vomiting.  He has a family history of PCa, with maternal grandfather with prostate cancer.      Score:  1-7 Mild 8-19 Moderate 20-35 Severe  Erectile dysfunction: His SHIM score is ***, which is no ED.   His previous SHIM score was 23.  He has been having difficulty with erections for four years.   His major complaint is maintaining his erections.  His libido is good.   His risk factors for ED are BPH.  He denies any painful erections or curvatures with his erections.      Score: 1-7 Severe ED 8-11 Moderate ED 12-16 Mild-Moderate ED 17-21 Mild ED 22-25 No ED     PMH: Past Medical History:  Diagnosis Date  . Benign enlargement of prostate   . Erectile dysfunction   . Erectile dysfunction   . Erectile dysfunction   . Hemorrhoids   . HLD (hyperlipidemia)   . Hypogonadism in male   . Urinary hesitancy     Surgical History: Past Surgical History:  Procedure Laterality Date  . Bicep surgery Right 03/12/2015  . bicep surgery Left    11/2015  . leg and ankle fracture  2005  . SHOULDER SURGERY Right 1994    Home Medications:  Allergies as of 06/22/2017      Reactions   Hydrocodone-acetaminophen Anxiety   "crazy"      Medication List       Accurate as of 06/21/17  1:24 PM. Always use your most  recent med list.          albuterol 108 (90 Base) MCG/ACT inhaler Commonly known as:  PROVENTIL HFA;VENTOLIN HFA Inhale into the lungs.   gabapentin 100 MG capsule Commonly known as:  NEURONTIN Take 100 mg by mouth. Reported on 10/11/2015   ibuprofen 600 MG tablet Commonly known as:  ADVIL,MOTRIN Take 600 mg by mouth.   oxyCODONE 5 MG immediate release tablet Commonly known as:  Oxy IR/ROXICODONE Take 5-10 mg by mouth. Reported on 10/11/2015   PERCOCET 5-325 MG tablet Generic drug:  oxyCODONE-acetaminophen Reported on 10/11/2015   pregabalin 75 MG capsule Commonly known as:  LYRICA Take 75 mg by mouth.   sildenafil 100 MG tablet Commonly known as:  VIAGRA Take 100 mg by mouth.   Testosterone 30 MG/ACT Soln Commonly known as:  AXIRON Place 2 sprays onto the skin daily.   NATESTO 5.5 MG/ACT Gel Generic drug:  Testosterone Place 1 spray into the nose 2 (two) times daily. Place 1 spray into each nostril twice daily   traMADol 50 MG tablet Commonly known as:  ULTRAM Take 50 mg by mouth. Reported on 10/11/2015   ZOFRAN ODT 4 MG disintegrating tablet Generic drug:  ondansetron Take 4 mg by mouth.  Reported on 10/11/2015       Allergies:  Allergies  Allergen Reactions  . Hydrocodone-Acetaminophen Anxiety    "crazy"    Family History: Family History  Problem Relation Age of Onset  . Prostate cancer Maternal Grandfather   . Diabetes Mellitus II Unknown   . Hypertension Unknown   . Prostate cancer Unknown        x6 Great uncles mom side  . Kidney disease Neg Hx     Social History:  reports that he has never smoked. He has quit using smokeless tobacco. He reports that he does not drink alcohol or use drugs.  ROS:                                        Physical Exam: There were no vitals taken for this visit.  GU: Patient with circumcised phallus.  Urethral meatus is patent.  No penile discharge. No penile lesions or rashes. Scrotum without  lesions, cysts, rashes and/or edema.  Testicles are located scrotally bilaterally. No masses are appreciated in the testicles. Left and right epididymis are normal. Rectal: Patient with  normal sphincter tone. Perineum without scarring or rashes. No rectal masses are appreciated. Prostate is approximately 45 grams, no nodules are appreciated. Seminal vesicles are normal.   Laboratory Data: Results for orders placed or performed in visit on 06/16/17  Testosterone  Result Value Ref Range   Testosterone 441 264 - 916 ng/dL   Previous PSA's:     0.3 ng/mL on 08/16/2013     0.4 ng/mL on 02/27/2014     0.3 ng/mL on 09/17/2014     0.3 ng/mL on 06/23/2016  Assessment & Plan:    1. BPH with LUTS  - IPSS score is 0/1, it is improving  - Continue conservative management, avoiding bladder irritants and timed voiding's  - RTC in am for labs  2. Erectile dysfunction:  SHIM score is 23.  He will follow up in 12 months for a SHIM and exam.    Michiel Cowboy, [facility] [address], [address] [place], [place] [address] [phone]

## 2017-06-21 NOTE — Telephone Encounter (Signed)
Spoke with pt in reference lab results. Made aware will need to seek tx with PCP for fatigue. Pt voiced understanding and requested to cancel appt for tomorrow.

## 2017-06-22 ENCOUNTER — Ambulatory Visit: Payer: Managed Care, Other (non HMO) | Admitting: Urology

## 2017-11-27 IMAGING — CT CT ABD-PELV W/ CM
1 of 3 series · 14 of 32 positions shown, 19 images · IV contrast (APPLIED)
Comparison: CT scan of February 16, 2012.

CLINICAL DATA: Left lower quadrant abdominal pain for several
weeks.

EXAM:
CT ABDOMEN AND PELVIS WITH CONTRAST
TECHNIQUE: Multidetector CT imaging of the abdomen and pelvis was performed
using the standard protocol following bolus administration of
intravenous contrast.
CONTRAST:  100 mL of Isovue 370 intravenously.

[Series 2: axial st · axial · 0.78mm/px · z∈[-1077,-632]mm · 14 of 101 slices shown, 19 images]
[im 6/101  soft-tissue]
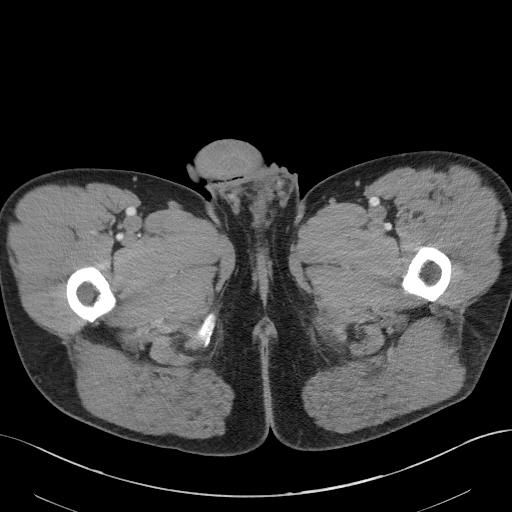
[im 6/101  bone]
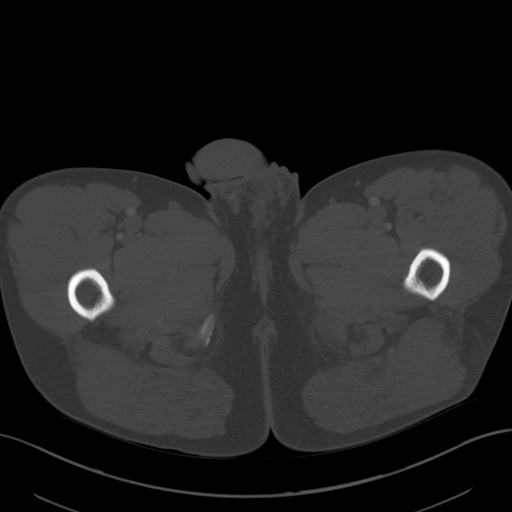
[im 12/101  soft-tissue]
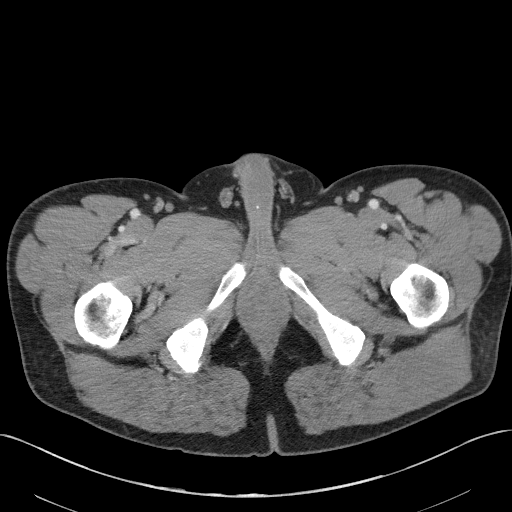
[im 23/101  soft-tissue]
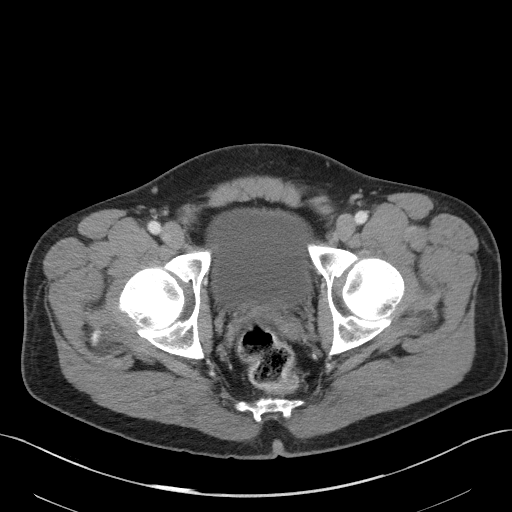
[im 28/101  soft-tissue]
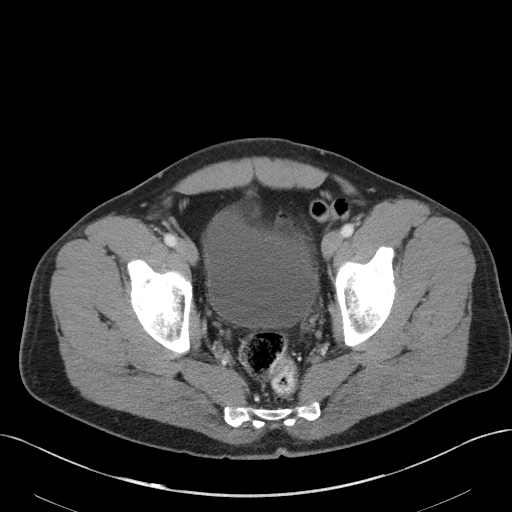
[im 34/101  soft-tissue]
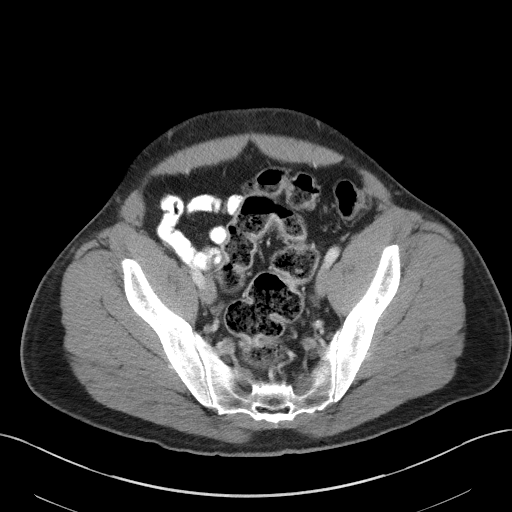
[im 45/101  soft-tissue]
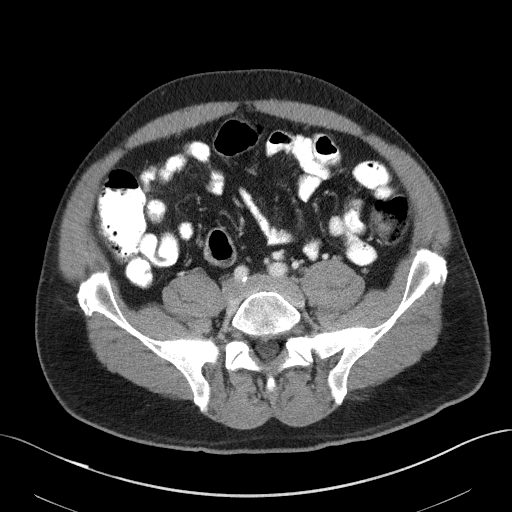
[im 51/101  soft-tissue]
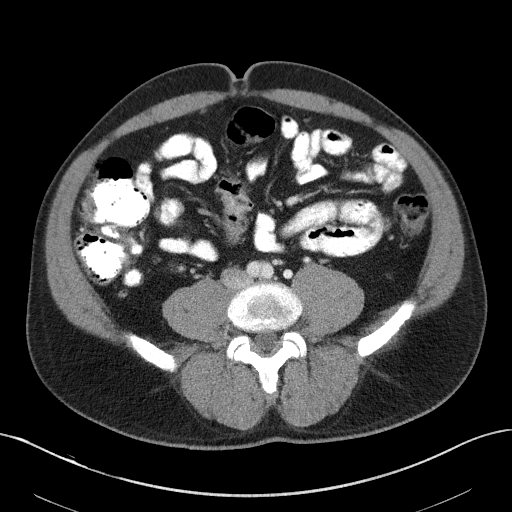
[im 56/101  soft-tissue]
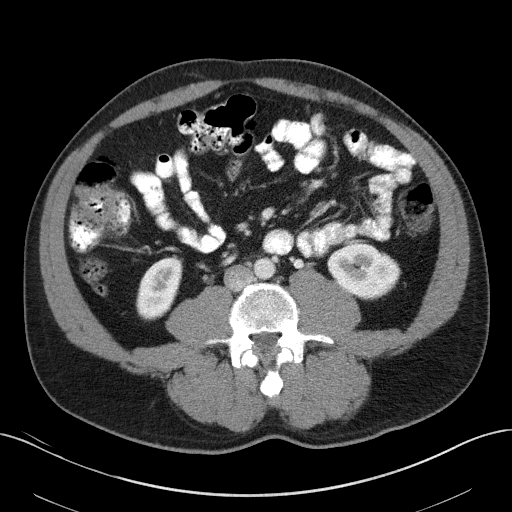
[im 67/101  soft-tissue]
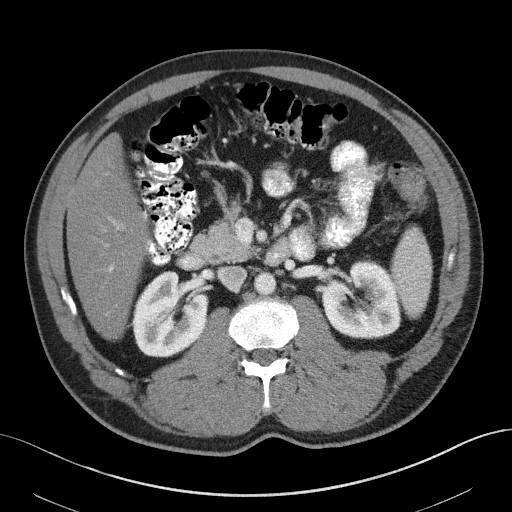
[im 67/101  bone]
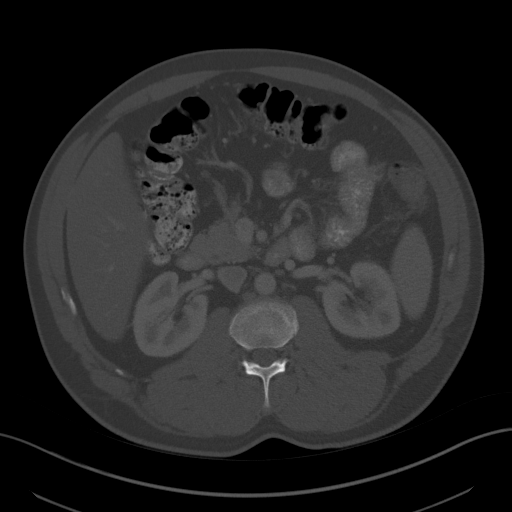
[im 73/101  soft-tissue]
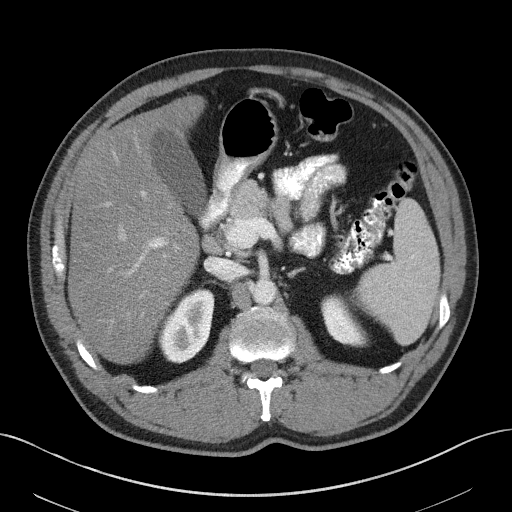
[im 78/101  soft-tissue]
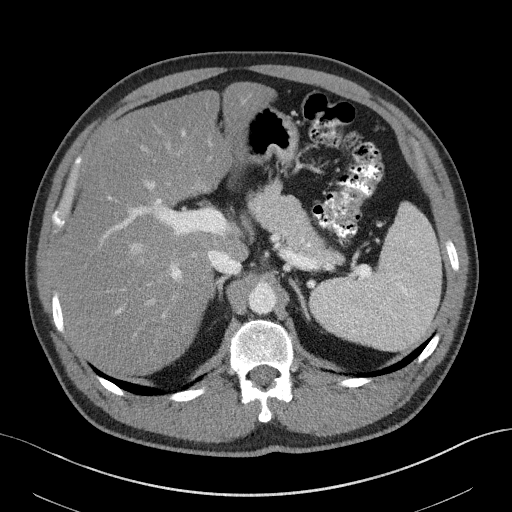
[im 78/101  lung]
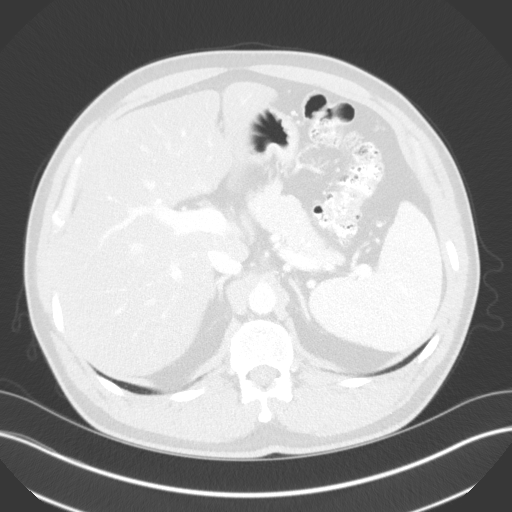
[im 84/101  lung]
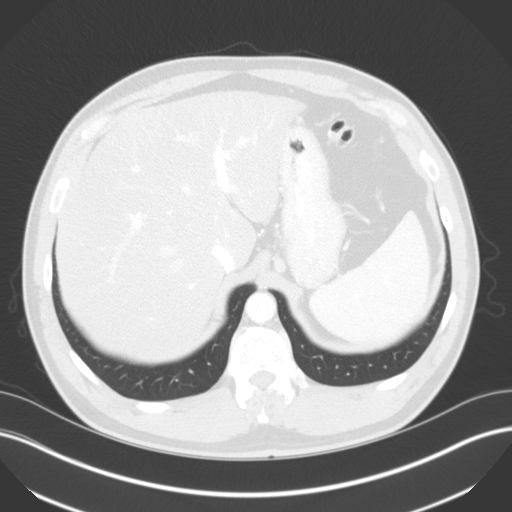
[im 89/101  soft-tissue]
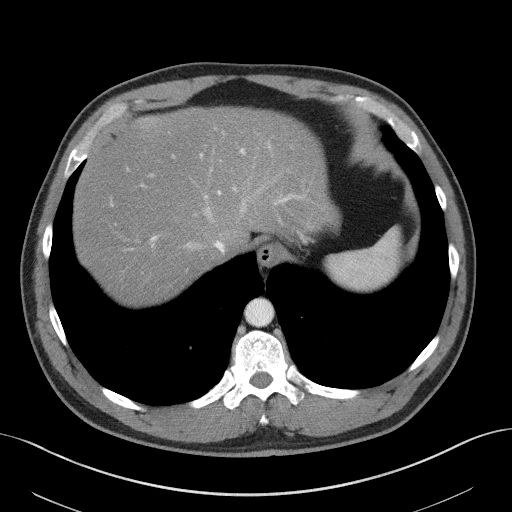
[im 89/101  lung]
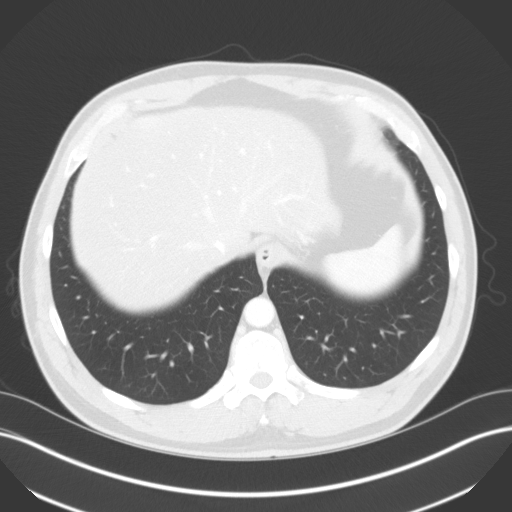
[im 95/101  soft-tissue]
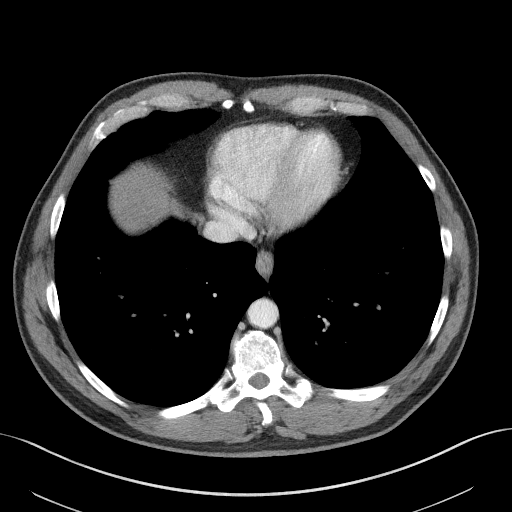
[im 95/101  lung]
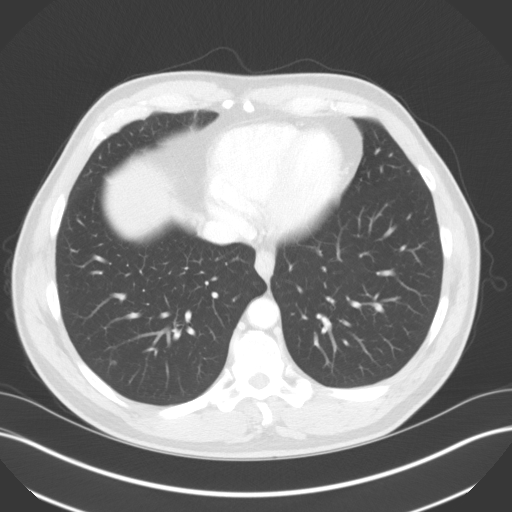

[14 of 32 positions shown; findings below may reference images not displayed]

FINDINGS: Lower chest: No acute abnormality.

Hepatobiliary: No gallstones are noted. Fatty infiltration of the
liver is noted. No focal hepatic lesion is noted.

Pancreas: Unremarkable. No pancreatic ductal dilatation or
surrounding inflammatory changes.

Spleen: Normal in size without focal abnormality.

Adrenals/Urinary Tract: Adrenal glands are unremarkable. Kidneys are
normal, without renal calculi, focal lesion, or hydronephrosis.
Bladder is unremarkable.

Stomach/Bowel: Stomach is within normal limits. Appendix appears
normal. No evidence of bowel wall thickening, distention, or
inflammatory changes.

Vascular/Lymphatic: No significant vascular findings are present. No
enlarged abdominal or pelvic lymph nodes.

Reproductive: Prostate is unremarkable.

Other: No abdominal wall hernia or abnormality. No abdominopelvic
ascites.

Musculoskeletal: No acute or significant osseous findings.
IMPRESSION: Probable fatty infiltration of the liver. No other abnormality seen
in the abdomen or pelvis.

## 2018-06-20 ENCOUNTER — Other Ambulatory Visit: Payer: Self-pay | Admitting: Internal Medicine

## 2018-06-20 DIAGNOSIS — R945 Abnormal results of liver function studies: Secondary | ICD-10-CM

## 2018-06-20 DIAGNOSIS — R7989 Other specified abnormal findings of blood chemistry: Secondary | ICD-10-CM

## 2018-06-23 ENCOUNTER — Ambulatory Visit
Admission: RE | Admit: 2018-06-23 | Discharge: 2018-06-23 | Disposition: A | Discharge status: Home / Self Care | Payer: Managed Care, Other (non HMO) | Source: Ambulatory Visit | Attending: Internal Medicine | Admitting: Internal Medicine

## 2018-06-23 DIAGNOSIS — R7989 Other specified abnormal findings of blood chemistry: Secondary | ICD-10-CM

## 2018-06-23 DIAGNOSIS — K760 Fatty (change of) liver, not elsewhere classified: Secondary | ICD-10-CM | POA: Insufficient documentation

## 2018-06-23 DIAGNOSIS — R945 Abnormal results of liver function studies: Secondary | ICD-10-CM | POA: Diagnosis present

## 2020-09-05 ENCOUNTER — Telehealth: Payer: Self-pay | Admitting: Unknown Physician Specialty

## 2020-09-05 ENCOUNTER — Other Ambulatory Visit: Payer: Self-pay | Admitting: Unknown Physician Specialty

## 2020-09-05 DIAGNOSIS — E663 Overweight: Secondary | ICD-10-CM

## 2020-09-05 DIAGNOSIS — U071 COVID-19: Secondary | ICD-10-CM

## 2020-09-05 NOTE — Telephone Encounter (Signed)
I connected by phone with Corey Henderson on 09/05/2020 at 3:45 PM to discuss the potential use of a new treatment for mild to moderate COVID-19 viral infection in non-hospitalized patients.  This patient is a 49 y.o. male that meets the FDA criteria for Emergency Use Authorization of COVID monoclonal antibody casirivimab/imdevimab, bamlanivimab/etesevimab, or sotrovimab.  Has a (+) direct SARS-CoV-2 viral test result  Has mild or moderate COVID-19   Is NOT hospitalized due to COVID-19  Is within 10 days of symptom onset  Has at least one of the high risk factor(s) for progression to severe COVID-19 and/or hospitalization as defined in EUA.  Specific high risk criteria : BMI > 25   I have spoken and communicated the following to the patient or parent/caregiver regarding COVID monoclonal antibody treatment:  1. FDA has authorized the emergency use for the treatment of mild to moderate COVID-19 in adults and pediatric patients with positive results of direct SARS-CoV-2 viral testing who are 75 years of age and older weighing at least 40 kg, and who are at high risk for progressing to severe COVID-19 and/or hospitalization.  2. The significant known and potential risks and benefits of COVID monoclonal antibody, and the extent to which such potential risks and benefits are unknown.  3. Information on available alternative treatments and the risks and benefits of those alternatives, including clinical trials.  4. Patients treated with COVID monoclonal antibody should continue to self-isolate and use infection control measures (e.g., wear mask, isolate, social distance, avoid sharing personal items, clean and disinfect high touch surfaces, and frequent handwashing) according to CDC guidelines.   5. The patient or parent/caregiver has the option to accept or refuse COVID monoclonal antibody treatment.  After reviewing this information with the patient, the patient has agreed to receive one of  the available covid 19 monoclonal antibodies and will be provided an appropriate fact sheet prior to infusion. [name], NP 09/05/2020 3:45 PM  Sx onset 12/27

## 2020-09-05 NOTE — Telephone Encounter (Signed)
I connected by phone with Corey Henderson on 09/05/2020 at 3:57 PM to discuss the potential use of a new treatment for mild to moderate COVID-19 viral infection in non-hospitalized patients.  This patient is a 49 y.o. male that meets the FDA criteria for Emergency Use Authorization of COVID monoclonal antibody casirivimab/imdevimab, bamlanivimab/etesevimab, or sotrovimab.  Has a (+) direct SARS-CoV-2 viral test result  Has mild or moderate COVID-19   Is NOT hospitalized due to COVID-19  Is within 10 days of symptom onset  Has at least one of the high risk factor(s) for progression to severe COVID-19 and/or hospitalization as defined in EUA.  Specific high risk criteria : BMI > 25   I have spoken and communicated the following to the patient or parent/caregiver regarding COVID monoclonal antibody treatment:  1. FDA has authorized the emergency use for the treatment of mild to moderate COVID-19 in adults and pediatric patients with positive results of direct SARS-CoV-2 viral testing who are 5 years of age and older weighing at least 40 kg, and who are at high risk for progressing to severe COVID-19 and/or hospitalization.  2. The significant known and potential risks and benefits of COVID monoclonal antibody, and the extent to which such potential risks and benefits are unknown.  3. Information on available alternative treatments and the risks and benefits of those alternatives, including clinical trials.  4. Patients treated with COVID monoclonal antibody should continue to self-isolate and use infection control measures (e.g., wear mask, isolate, social distance, avoid sharing personal items, clean and disinfect "high touch" surfaces, and frequent handwashing) according to CDC guidelines.   5. The patient or parent/caregiver has the option to accept or refuse COVID monoclonal antibody treatment.  After reviewing this information with the patient, the patient has agreed to receive one of  the available covid 19 monoclonal antibodies and will be provided an appropriate fact sheet prior to infusion. [name], NP 09/05/2020 3:57 PM

## 2020-09-06 ENCOUNTER — Ambulatory Visit (HOSPITAL_COMMUNITY)
Admission: RE | Admit: 2020-09-06 | Discharge: 2020-09-06 | Disposition: A | Discharge status: Home / Self Care | Payer: Managed Care, Other (non HMO) | Source: Ambulatory Visit | Attending: Pulmonary Disease | Admitting: Pulmonary Disease

## 2020-09-06 DIAGNOSIS — U071 COVID-19: Secondary | ICD-10-CM

## 2020-09-06 DIAGNOSIS — E663 Overweight: Secondary | ICD-10-CM | POA: Diagnosis not present

## 2020-09-06 MED ORDER — DIPHENHYDRAMINE HCL 50 MG/ML IJ SOLN: 50.0000 mg | Freq: Once | INTRAMUSCULAR | Status: DC | PRN

## 2020-09-06 MED ORDER — SODIUM CHLORIDE 0.9 % IV SOLN: Freq: Once | INTRAVENOUS | Status: AC

## 2020-09-06 MED ORDER — SODIUM CHLORIDE 0.9 % IV SOLN: INTRAVENOUS | Status: DC | PRN

## 2020-09-06 MED ORDER — METHYLPREDNISOLONE SODIUM SUCC 125 MG IJ SOLR: 125.0000 mg | Freq: Once | INTRAMUSCULAR | Status: DC | PRN

## 2020-09-06 MED ORDER — ALBUTEROL SULFATE HFA 108 (90 BASE) MCG/ACT IN AERS: 2.0000 | INHALATION_SPRAY | Freq: Once | RESPIRATORY_TRACT | Status: DC | PRN

## 2020-09-06 MED ORDER — FAMOTIDINE IN NACL 20-0.9 MG/50ML-% IV SOLN: 20.0000 mg | Freq: Once | INTRAVENOUS | Status: DC | PRN

## 2020-09-06 MED ORDER — EPINEPHRINE 0.3 MG/0.3ML IJ SOAJ: 0.3000 mg | Freq: Once | INTRAMUSCULAR | Status: DC | PRN

## 2020-09-06 NOTE — Progress Notes (Signed)
Patient reviewed Fact Sheet for Patients, Parents, and Caregivers for Emergency Use Authorization (EUA) of casirivimab and imdevimab for the Treatment of Coronavirus. Patient also reviewed and is agreeable to the estimated cost of treatment. Patient is agreeable to proceed.   

## 2020-09-06 NOTE — Discharge Instructions (Signed)
10 Things You Can Do to Manage Your COVID-19 Symptoms at Home If you have possible or confirmed COVID-19: 1. Stay home from work and school. And stay away from other public places. If you must go out, avoid using any kind of public transportation, ridesharing, or taxis. 2. Monitor your symptoms carefully. If your symptoms get worse, call your healthcare provider immediately. 3. Get rest and stay hydrated. 4. If you have a medical appointment, call the healthcare provider ahead of time and tell them that you have or may have COVID-19. 5. For medical emergencies, call 911 and notify the dispatch personnel that you have or may have COVID-19. 6. Cover your cough and sneezes with a tissue or use the inside of your elbow. 7. Wash your hands often with soap and water for at least 20 seconds or clean your hands with an alcohol-based hand sanitizer that contains at least 60% alcohol. 8. As much as possible, stay in a specific room and away from other people in your home. Also, you should use a separate bathroom, if available. If you need to be around other people in or outside of the home, wear a mask. 9. Avoid sharing personal items with other people in your household, like dishes, towels, and bedding. 10. Clean all surfaces that are touched often, like counters, tabletops, and doorknobs. Use household cleaning sprays or wipes according to the label instructions. cdc.gov/coronavirus 03/08/2019 This information is not intended to replace advice given to you by your health care provider. Make sure you discuss any questions you have with your health care provider. Document Revised: 08/10/2019 Document Reviewed: 08/10/2019 Elsevier Patient Education  2020 Elsevier Inc. What types of side effects do monoclonal antibody drugs cause?  Common side effects  In general, the more common side effects caused by monoclonal antibody drugs include: . Allergic reactions, such as hives or itching . Flu-like signs and  symptoms, including chills, fatigue, fever, and muscle aches and pains . Nausea, vomiting . Diarrhea . Skin rashes . Low blood pressure   The CDC is recommending patients who receive monoclonal antibody treatments wait at least 90 days before being vaccinated.  Currently, there are no data on the safety and efficacy of mRNA COVID-19 vaccines in persons who received monoclonal antibodies or convalescent plasma as part of COVID-19 treatment. Based on the estimated half-life of such therapies as well as evidence suggesting that reinfection is uncommon in the 90 days after initial infection, vaccination should be deferred for at least 90 days, as a precautionary measure until additional information becomes available, to avoid interference of the antibody treatment with vaccine-induced immune responses. If you have any questions or concerns after the infusion please call the Advanced Practice Provider on call at [phone]. This number is ONLY intended for your use regarding questions or concerns about the infusion post-treatment side-effects.  Please do not provide this number to others for use. For return to work notes please contact your primary care provider.   If someone you know is interested in receiving treatment please have them call the COVID hotline at [phone].   

## 2020-09-06 NOTE — Progress Notes (Signed)
  Diagnosis: COVID-19  Physician:Drc Delford Field   Procedure: [facility] Med: casirivimab\imdevimab infusion - Provided patient with casirivimab\imdevimab fact sheet for patients, parents and caregivers prior to infusion.  Complications: No immediate complications noted.  Discharge: Discharged home   Corey Henderson 09/06/2020

## 2021-03-16 ENCOUNTER — Encounter: Payer: Self-pay | Admitting: Emergency Medicine

## 2021-03-16 ENCOUNTER — Emergency Department: Payer: Managed Care, Other (non HMO)

## 2021-03-16 ENCOUNTER — Other Ambulatory Visit: Payer: Self-pay

## 2021-03-16 ENCOUNTER — Emergency Department
Admission: EM | Admit: 2021-03-16 | Discharge: 2021-03-16 | Disposition: A | Discharge status: Home / Self Care | Payer: Managed Care, Other (non HMO) | Attending: Emergency Medicine | Admitting: Emergency Medicine

## 2021-03-16 DIAGNOSIS — Z79899 Other long term (current) drug therapy: Secondary | ICD-10-CM | POA: Insufficient documentation

## 2021-03-16 DIAGNOSIS — K3589 Other acute appendicitis without perforation or gangrene: Secondary | ICD-10-CM | POA: Diagnosis not present

## 2021-03-16 DIAGNOSIS — R1030 Lower abdominal pain, unspecified: Secondary | ICD-10-CM | POA: Diagnosis present

## 2021-03-16 DIAGNOSIS — K6389 Other specified diseases of intestine: Secondary | ICD-10-CM

## 2021-03-16 LAB — URINALYSIS, COMPLETE (UACMP) WITH MICROSCOPIC
Bacteria, UA: NONE SEEN
Bilirubin Urine: NEGATIVE
Glucose, UA: NEGATIVE mg/dL
Hgb urine dipstick: NEGATIVE
Ketones, ur: NEGATIVE mg/dL
Leukocytes,Ua: NEGATIVE
Nitrite: NEGATIVE
Protein, ur: NEGATIVE mg/dL
Specific Gravity, Urine: 1.016 (ref 1.005–1.030)
Squamous Epithelial / HPF: NONE SEEN (ref 0–5)
WBC, UA: NONE SEEN WBC/hpf (ref 0–5)
pH: 7 (ref 5.0–8.0)

## 2021-03-16 LAB — BASIC METABOLIC PANEL
Anion gap: 6 (ref 5–15)
BUN: 12 mg/dL (ref 6–20)
CO2: 27 mmol/L (ref 22–32)
Calcium: 9.4 mg/dL (ref 8.9–10.3)
Chloride: 102 mmol/L (ref 98–111)
Creatinine, Ser: 0.88 mg/dL (ref 0.61–1.24)
GFR, Estimated: >60 mL/min (ref >60)
Glucose, Bld: 102 mg/dL — ABNORMAL HIGH (ref 70–99)
Potassium: 4.3 mmol/L (ref 3.5–5.1)
Sodium: 135 mmol/L (ref 135–145)

## 2021-03-16 LAB — CBC
HCT: 45.5 % (ref 39.0–52.0)
Hemoglobin: 16.1 g/dL (ref 13.0–17.0)
MCH: 31.2 pg (ref 26.0–34.0)
MCHC: 35.4 g/dL (ref 30.0–36.0)
MCV: 88.2 fL (ref 80.0–100.0)
Platelets: 179 10*3/uL (ref 150–400)
RBC: 5.16 MIL/uL (ref 4.22–5.81)
RDW: 11.5 % (ref 11.5–15.5)
WBC: 8.1 10*3/uL (ref 4.0–10.5)
nRBC: 0 % (ref 0.0–0.2)

## 2021-03-16 MED ORDER — IOHEXOL 350 MG/ML SOLN
100.0000 mL | Freq: Once | INTRAVENOUS | Status: AC | PRN
  Administered 2021-03-16: 100 mL via INTRAVENOUS
  Filled 2021-03-16: qty 100

## 2021-03-16 MED ORDER — PREDNISONE 20 MG PO TABS
60.0000 mg | ORAL_TABLET | Freq: Once | ORAL | Status: AC
  Administered 2021-03-16: 60 mg via ORAL
  Filled 2021-03-16: qty 3

## 2021-03-16 MED ORDER — KETOROLAC TROMETHAMINE 30 MG/ML IJ SOLN
30.0000 mg | Freq: Once | INTRAMUSCULAR | Status: AC
  Administered 2021-03-16: 30 mg via INTRAVENOUS
  Filled 2021-03-16: qty 1

## 2021-03-16 MED ORDER — PREDNISONE 10 MG (21) PO TBPK: ORAL_TABLET | ORAL | 0 refills | Status: AC

## 2021-03-16 NOTE — ED Notes (Signed)
Patient transported to CT 

## 2021-03-16 NOTE — ED Provider Notes (Signed)
[facility] Emergency Department Provider Note ____________________________________________   Event Date/Time   First MD Initiated Contact with Patient 03/16/21 1713     (approximate)  I have reviewed the triage vital signs and the nursing notes.   HISTORY  Chief Complaint Flank Pain  HPI Corey Henderson is a 50 y.o. male with history of BPH and hyperlipidemia presents to the emergency department for treatment and evaluation of left-sided lower abdominal pain and right-sided flank pain for the past 2 days.  She denies nausea, vomiting, diarrhea, decreased appetite, or fever.  No alleviating measures attempted prior to arrival.       Past Medical History:  Diagnosis Date   Benign enlargement of prostate    Erectile dysfunction    Erectile dysfunction    Erectile dysfunction    Hemorrhoids    HLD (hyperlipidemia)    Hypogonadism in male    Urinary hesitancy     Patient Active Problem List   Diagnosis Date Noted   BPH with obstruction/lower urinary tract symptoms 04/10/2015   Hypogonadism in male 04/10/2015   Other male erectile dysfunction 04/10/2015    Past Surgical History:  Procedure Laterality Date   Bicep surgery Right 03/12/2015   bicep surgery Left    11/2015   leg and ankle fracture  2005   SHOULDER SURGERY Right 1994    Prior to Admission medications   Medication Sig Start Date End Date Taking? Authorizing Provider  predniSONE (STERAPRED UNI-PAK 21 TAB) 10 MG (21) TBPK tablet Take 6 tablets on the first day and decrease by 1 tablet each day until finished. 03/16/21  Yes Yassir Enis B, FNP  albuterol (PROVENTIL HFA;VENTOLIN HFA) 108 (90 Base) MCG/ACT inhaler Inhale into the lungs. 10/01/16 10/01/17  [provider]  gabapentin (NEURONTIN) 100 MG capsule Take 100 mg by mouth. Reported on 10/11/2015 03/12/15   [provider]  ibuprofen (ADVIL,MOTRIN) 600 MG tablet Take 600 mg by mouth.    [provider]   NATESTO 5.5 MG/ACT GEL Place 1 spray into the nose 2 (two) times daily. Place 1 spray into each nostril twice daily Patient not taking: Reported on 06/22/2016 10/11/15   Michiel Cowboy A, PA-C  ondansetron (ZOFRAN ODT) 4 MG disintegrating tablet Take 4 mg by mouth. Reported on 10/11/2015 03/12/15   [provider]  oxyCODONE (OXY IR/ROXICODONE) 5 MG immediate release tablet Take 5-10 mg by mouth. Reported on 10/11/2015 03/12/15   [provider]  oxyCODONE-acetaminophen (PERCOCET) 5-325 MG per tablet Reported on 10/11/2015 03/21/15   [provider]  pregabalin (LYRICA) 75 MG capsule Take 75 mg by mouth. 06/11/15   [provider]  sildenafil (VIAGRA) 100 MG tablet Take 100 mg by mouth.    [provider]  Testosterone (AXIRON) 30 MG/ACT SOLN Place 2 sprays onto the skin daily. Patient not taking: Reported on 06/22/2016 04/09/15   Michiel Cowboy A, PA-C  traMADol (ULTRAM) 50 MG tablet Take 50 mg by mouth. Reported on 10/11/2015 03/12/15   [provider]    Allergies Sulfa antibiotics and Hydrocodone-acetaminophen  Family History  Problem Relation Age of Onset   Prostate cancer Maternal Grandfather    Diabetes Mellitus II Unknown    Hypertension Unknown    Prostate cancer Unknown        x6 Great uncles mom side   Kidney disease Neg Hx     Social History Social History   Tobacco Use   Smoking status: Never   Smokeless tobacco:  Former  Substance Use Topics   Alcohol use: No    Alcohol/week: 0.0 standard drinks   Drug use: No    Review of Systems  Constitutional: No fever/chills Eyes: No visual changes. ENT: No sore throat. Cardiovascular: Denies chest pain. Respiratory: Denies shortness of breath. Gastrointestinal: Positive for abdominal pain.  No nausea, no vomiting.  No diarrhea.  No constipation. Genitourinary: Negative for dysuria. Musculoskeletal: Positive for right-sided back pain Skin: Negative for rash. Neurological:  Negative for headaches, focal weakness or numbness.   ____________________________________________   PHYSICAL EXAM:  VITAL SIGNS: ED Triage Vitals [03/16/21 1434]  Enc Vitals Group     BP 138/88     Pulse Rate 85     Resp 16     Temp 97.9 F (36.6 C)     Temp Source Oral     SpO2 100 %     Weight 235 lb (106.6 kg)     Height 5\' 11"  (1.803 m)     Head Circumference      Peak Flow      Pain Score 5     Pain Loc      Pain Edu?      Excl. in [place]?     Constitutional: Alert and oriented. Well appearing and in no acute distress. Eyes: Conjunctivae are normal.  Head: Atraumatic. Nose: No congestion/rhinnorhea. Mouth/Throat: Mucous membranes are moist. Oropharynx non-erythematous. Neck: No stridor.   Hematological/Lymphatic/Immunilogical: No cervical lymphadenopathy. Cardiovascular: Normal rate, regular rhythm. Grossly normal heart sounds.  Good peripheral circulation. Respiratory: Normal respiratory effort.  No retractions. Lungs CTAB. Gastrointestinal: Soft and left lower quadrant tenderness. No distention. No abdominal bruits. Right side CVA tenderness. Genitourinary:  Musculoskeletal: No lower extremity tenderness nor edema. No joint effusions. Neurologic:  Normal speech and language. No gross focal neurologic deficits are appreciated. No gait instability. Skin:  Skin is warm, dry and intact. No rash noted. Psychiatric: Mood and affect are normal. Speech and behavior are normal.  ____________________________________________   LABS (all labs ordered are listed, but only abnormal results are displayed)  Labs Reviewed  URINALYSIS, COMPLETE (UACMP) WITH MICROSCOPIC - Abnormal; Notable for the following components:      Result Value   Color, Urine YELLOW (*)    APPearance CLEAR (*)    All other components within normal limits  BASIC METABOLIC PANEL - Abnormal; Notable for the following components:   Glucose, Bld 102 (*)    All other components within normal limits  CBC    ____________________________________________  EKG  Not indicated. ____________________________________________  RADIOLOGY  ED MD interpretation:    CT abdomen pelvis with contrast shows an epiploic appendagitis without evidence of diverticular disease.  I, , personally viewed and evaluated these images (plain radiographs) as part of my medical decision making, as well as reviewing the written report by the radiologist.  Official radiology report(s): CT ABDOMEN PELVIS W CONTRAST  Result Date: 03/16/2021 CLINICAL DATA:  Left flank pain for 2 days, radiating to left lower quadrant EXAM: CT ABDOMEN AND PELVIS WITH CONTRAST TECHNIQUE: Multidetector CT imaging of the abdomen and pelvis was performed using the standard protocol following bolus administration of intravenous contrast. CONTRAST:  05/17/2021 OMNIPAQUE IOHEXOL 350 MG/ML SOLN COMPARISON:  08/24/2016 FINDINGS: Lower chest: No acute abnormality. Hepatobiliary: No solid liver abnormality is seen. Hepatic steatosis. No gallstones, gallbladder wall thickening, or biliary dilatation. Pancreas: Unremarkable. No pancreatic ductal dilatation or surrounding inflammatory changes. Spleen: Normal in size without significant abnormality. Adrenals/Urinary Tract: Adrenal glands are unremarkable.  Kidneys are normal, without renal calculi, solid lesion, or hydronephrosis. Bladder is unremarkable. Stomach/Bowel: Stomach is within normal limits. Appendix appears normal. No significant diverticular disease of the colon. There is mild fat stranding adjacent to the distal descending colon without significant wall thickening (series 2, image 65). Vascular/Lymphatic: No significant vascular findings are present. No enlarged abdominal or pelvic lymph nodes. Reproductive: No mass or other significant abnormality. Other: No abdominal wall hernia or abnormality. No abdominopelvic ascites. Musculoskeletal: No acute or significant osseous findings. IMPRESSION: 1.  There is mild fat stranding adjacent to the distal descending colon without significant wall thickening or diverticular disease. Although nonspecific, findings are suggestive of epiploic appendagitis, a benign, self-limited inflammatory condition. 2. No evidence of urinary tract calculus or hydronephrosis. 3. Hepatic steatosis. Electronically Signed   By: Lauralyn Primes M.D.   On: 03/16/2021 17:50    ____________________________________________   PROCEDURES  Procedure(s) performed (including Critical Care):  Procedures  ____________________________________________   INITIAL IMPRESSION / ASSESSMENT AND PLAN     50 year old male presenting to the emergency department for treatment and evaluation of left lower quadrant abdominal pain and right flank pain.  Symptoms started 2 days ago.  No other associated symptoms.  Plan will be to get a CT of the abdomen pelvis with IV contrast.  Labs obtained while awaiting ER room assignment are overall reassuring.  Urinalysis is negative for indication of acute cystitis, pyelonephritis, and no detected hemoglobin or red blood cells.  DIFFERENTIAL DIAGNOSIS  Kidney stone, diverticulitis, diverticulosis, enteritis, colitis  ED COURSE  IV Toradol given while here in the department with decrease in abdominal pain.  CT results are consistent with the patient's history and exam.  He will be treated with prednisone and encouraged to follow-up with GI.  Strict ER return precautions were discussed as well.  Patient stable for discharge at this time.    ___________________________________________   FINAL CLINICAL IMPRESSION(S) / ED DIAGNOSES  Final diagnoses:  Epiploic appendagitis     ED Discharge Orders          Ordered    predniSONE (STERAPRED UNI-PAK 21 TAB) 10 MG (21) TBPK tablet        03/16/21 1843             Delos KYM FENTER was evaluated in Emergency Department on 03/16/2021 for the symptoms described in the history of present  illness. He was evaluated in the context of the global COVID-19 pandemic, which necessitated consideration that the patient might be at risk for infection with the SARS-CoV-2 virus that causes COVID-19. Institutional protocols and algorithms that pertain to the evaluation of patients at risk for COVID-19 are in a state of rapid change based on information released by regulatory bodies including the CDC and federal and state organizations. These policies and algorithms were followed during the patient's care in the ED.   Note:  This document was prepared using Dragon voice recognition software and may include unintentional dictation errors.    Chinita Pester, FNP 03/16/21 1926    Sharman Cheek, MD 03/17/21 0002

## 2021-03-16 NOTE — ED Triage Notes (Signed)
Pt via POV from home. Pt c/o L flank pain x2 days, pt travels to [place] [facility]. Denies NVD. Denies urinary sym. Denies hx of kidney stone. Pt is A&OX4 and NAD.

## 2021-12-05 ENCOUNTER — Other Ambulatory Visit: Payer: Self-pay

## 2021-12-05 ENCOUNTER — Emergency Department: Payer: Managed Care, Other (non HMO)

## 2021-12-05 ENCOUNTER — Emergency Department
Admission: EM | Admit: 2021-12-05 | Discharge: 2021-12-05 | Disposition: A | Discharge status: Home / Self Care | Payer: Managed Care, Other (non HMO) | Attending: Emergency Medicine | Admitting: Emergency Medicine

## 2021-12-05 DIAGNOSIS — K529 Noninfective gastroenteritis and colitis, unspecified: Secondary | ICD-10-CM | POA: Insufficient documentation

## 2021-12-05 DIAGNOSIS — R109 Unspecified abdominal pain: Secondary | ICD-10-CM | POA: Diagnosis present

## 2021-12-05 LAB — CBC
HCT: 51.1 % (ref 39.0–52.0)
Hemoglobin: 17.7 g/dL — ABNORMAL HIGH (ref 13.0–17.0)
MCH: 30.3 pg (ref 26.0–34.0)
MCHC: 34.6 g/dL (ref 30.0–36.0)
MCV: 87.4 fL (ref 80.0–100.0)
Platelets: 156 10*3/uL (ref 150–400)
RBC: 5.85 MIL/uL — ABNORMAL HIGH (ref 4.22–5.81)
RDW: 11.9 % (ref 11.5–15.5)
WBC: 8.9 10*3/uL (ref 4.0–10.5)
nRBC: 0 % (ref 0.0–0.2)

## 2021-12-05 LAB — GASTROINTESTINAL PANEL BY PCR, STOOL (REPLACES STOOL CULTURE)

## 2021-12-05 LAB — URINALYSIS, ROUTINE W REFLEX MICROSCOPIC
Bilirubin Urine: NEGATIVE
Glucose, UA: NEGATIVE mg/dL
Hgb urine dipstick: NEGATIVE
Ketones, ur: NEGATIVE mg/dL
Leukocytes,Ua: NEGATIVE
Nitrite: NEGATIVE
Protein, ur: NEGATIVE mg/dL
Specific Gravity, Urine: >1.046 — ABNORMAL HIGH (ref 1.005–1.030)
pH: 5 (ref 5.0–8.0)

## 2021-12-05 LAB — COMPREHENSIVE METABOLIC PANEL
ALT: 62 U/L — ABNORMAL HIGH (ref 0–44)
AST: 35 U/L (ref 15–41)
Albumin: 4.5 g/dL (ref 3.5–5.0)
Alkaline Phosphatase: 48 U/L (ref 38–126)
Anion gap: 10 (ref 5–15)
BUN: 19 mg/dL (ref 6–20)
CO2: 26 mmol/L (ref 22–32)
Calcium: 9.1 mg/dL (ref 8.9–10.3)
Chloride: 100 mmol/L (ref 98–111)
Creatinine, Ser: 1.09 mg/dL (ref 0.61–1.24)
GFR, Estimated: >60 mL/min (ref >60)
Glucose, Bld: 121 mg/dL — ABNORMAL HIGH (ref 70–99)
Potassium: 3.9 mmol/L (ref 3.5–5.1)
Sodium: 136 mmol/L (ref 135–145)
Total Bilirubin: 2 mg/dL — ABNORMAL HIGH (ref 0.3–1.2)
Total Protein: 8 g/dL (ref 6.5–8.1)

## 2021-12-05 LAB — C DIFFICILE QUICK SCREEN W PCR REFLEX
C Diff antigen: NEGATIVE
C Diff interpretation: NOT DETECTED
C Diff toxin: NEGATIVE

## 2021-12-05 LAB — LIPASE, BLOOD: Lipase: 28 U/L (ref 11–51)

## 2021-12-05 MED ORDER — ONDANSETRON 4 MG PO TBDP: 4.0000 mg | ORAL_TABLET | Freq: Three times a day (TID) | ORAL | 0 refills | Status: AC | PRN

## 2021-12-05 MED ORDER — IOHEXOL 300 MG/ML  SOLN
100.0000 mL | Freq: Once | INTRAMUSCULAR | Status: AC | PRN
  Administered 2021-12-05: 100 mL via INTRAVENOUS

## 2021-12-05 MED ORDER — IBUPROFEN 600 MG PO TABS
600.0000 mg | ORAL_TABLET | Freq: Once | ORAL | Status: AC
  Administered 2021-12-05: 600 mg via ORAL
  Filled 2021-12-05: qty 1

## 2021-12-05 MED ORDER — SODIUM CHLORIDE 0.9 % IV BOLUS
1000.0000 mL | Freq: Once | INTRAVENOUS | Status: AC
  Administered 2021-12-05: 1000 mL via INTRAVENOUS

## 2021-12-05 MED ORDER — DICYCLOMINE HCL 10 MG PO CAPS
10.0000 mg | ORAL_CAPSULE | Freq: Once | ORAL | Status: AC
  Administered 2021-12-05: 10 mg via ORAL
  Filled 2021-12-05: qty 1

## 2021-12-05 MED ORDER — ONDANSETRON HCL 4 MG/2ML IJ SOLN
4.0000 mg | Freq: Once | INTRAMUSCULAR | Status: DC
  Filled 2021-12-05: qty 2

## 2021-12-05 MED ORDER — DICYCLOMINE HCL 10 MG PO CAPS: 10.0000 mg | ORAL_CAPSULE | Freq: Three times a day (TID) | ORAL | 0 refills | Status: AC

## 2021-12-05 NOTE — ED Notes (Signed)
Signature pad not working, verbal d/c consent taken by this Charity fundraiser. ?

## 2021-12-05 NOTE — ED Notes (Signed)
ED Provider at bedside. 

## 2021-12-05 NOTE — ED Provider Notes (Signed)
? ?[facility] ?Provider Note ? ? ? Event Date/Time  ? First MD Initiated Contact with Patient 12/05/21 1226   ?  (approximate) ? ? ?History  ? ?Chief Complaint ?Abdominal Pain ? ? ?HPI ?Corey Henderson is a 51 y.o. male, history of hyperlipidemia, hypogonadism, hemorrhoids, presents the emergency department for evaluation of abdominal pain.  Patient states that this pain has been going on for the past 2 days, reports generalized discomfort across mid right and mid left side of abdomen.  Endorses mild nausea, as well as body aches and foul-smelling diarrhea.  He states his stools are not black or tarry like.  He does note that he has been on several antibiotics over the past month for reported sinus/respiratory infections.  Denies fever/chills, chest pain, shortness of breath, cough, vomiting, blood in stool, urinary symptoms, rashes, weakness/lightheadedness, or headache. ? ?History Limitations: No limitations ? ?  ? ? ?Physical Exam  ?Triage Vital Signs: ?ED Triage Vitals  ?Enc Vitals Group  ?   BP 12/05/21 1225 (!) 145/90  ?   Pulse Rate 12/05/21 1225 (!) 101  ?   Resp 12/05/21 1225 15  ?   Temp 12/05/21 1225 98.2 ?F (36.8 ?C)  ?   Temp Source 12/05/21 1225 Oral  ?   SpO2 12/05/21 1225 97 %  ?   Weight --   ?   Height --   ?   Head Circumference --   ?   Peak Flow --   ?   Pain Score 12/05/21 1217 10  ?   Pain Loc --   ?   Pain Edu? --   ?   Excl. in [place]? --   ? ? ?Most recent vital signs: ?Vitals:  ? 12/05/21 1521 12/05/21 1703  ?BP:  117/87  ?Pulse: 98 88  ?Resp: (!) 22   ?Temp:    ?SpO2: 94% 95%  ? ? ?General: Awake, NAD.  ?Skin: Warm, dry.  ?CV: Good peripheral perfusion.  ?Resp: Normal effort.  ?Abd: Soft, mild distention noted.  No remarkable tenderness with palpation.  Negative Murphy sign.  No rebound tenderness. ?Neuro: At baseline. No gross neurological deficits.  ?Other: No CVA tenderness ? ?Physical Exam ? ? ? ?ED Results / Procedures / Treatments  ?Labs ?(all labs ordered are  listed, but only abnormal results are displayed) ?Labs Reviewed  ?GASTROINTESTINAL PANEL BY PCR, STOOL (REPLACES STOOL CULTURE) - Abnormal; Notable for the following components:  ?    Result Value  ? Norovirus GI/GII DETECTED (*)   ? All other components within normal limits  ?COMPREHENSIVE METABOLIC PANEL - Abnormal; Notable for the following components:  ? Glucose, Bld 121 (*)   ? ALT 62 (*)   ? Total Bilirubin 2.0 (*)   ? All other components within normal limits  ?CBC - Abnormal; Notable for the following components:  ? RBC 5.85 (*)   ? Hemoglobin 17.7 (*)   ? All other components within normal limits  ?URINALYSIS, ROUTINE W REFLEX MICROSCOPIC - Abnormal; Notable for the following components:  ? Color, Urine YELLOW (*)   ? APPearance CLEAR (*)   ? Specific Gravity, Urine >1.046 (*)   ? All other components within normal limits  ?C DIFFICILE QUICK SCREEN W PCR REFLEX    ?LIPASE, BLOOD  ? ? ? ?EKG ?Not applicable. ? ? ?RADIOLOGY ? ?ED Provider Interpretation: I personally reviewed the CT scan, signs suggestive of gastroenteritis. ? ?CT Abdomen Pelvis W Contrast ? ?Result Date:  12/05/2021 ?CLINICAL DATA:  A 51 year old male presents for evaluation of abdominal pain. EXAM: CT ABDOMEN AND PELVIS WITH CONTRAST TECHNIQUE: Multidetector CT imaging of the abdomen and pelvis was performed using the standard protocol following bolus administration of intravenous contrast. RADIATION DOSE REDUCTION: This exam was performed according to the departmental dose-optimization program which includes automated exposure control, adjustment of the mA and/or kV according to patient size and/or use of iterative reconstruction technique. CONTRAST:  134mL OMNIPAQUE IOHEXOL 300 MG/ML  SOLN COMPARISON:  Comparison made with March 16, 2021. FINDINGS: Lower chest: Lung bases are clear.  No effusion or consolidation. Hepatobiliary: Moderate to severe hepatic steatosis. No focal, suspicious hepatic lesion. The portal vein is patent. No  pericholecystic stranding. No biliary duct dilation. Pancreas: Normal, without mass, inflammation or ductal dilatation. Spleen: Normal. Adrenals/Urinary Tract: Adrenal glands are unremarkable. Symmetric renal enhancement. No sign of hydronephrosis. No suspicious renal lesion or perinephric stranding. Urinary bladder is grossly unremarkable. Stomach/Bowel: Fluid-filled small bowel loops. Question mild mucosal thickening of distal small bowel. Mild injection of mesenteric vasculature/edema of the mesentery. Normal appendix. No pericolonic stranding. No signs of bowel obstruction. Stomach is under distended limiting assessment. Vascular/Lymphatic: Aorta with smooth contours. IVC with smooth contours. No aneurysmal dilation of the abdominal aorta. There is no gastrohepatic or hepatoduodenal ligament lymphadenopathy. No retroperitoneal or mesenteric lymphadenopathy. No pelvic sidewall lymphadenopathy. Reproductive: Unremarkable by CT. Other: No pneumoperitoneum.  No ascites. Musculoskeletal: No acute bone finding. No destructive bone process. Spinal degenerative changes. IMPRESSION: 1. Signs of gastroenteritis with more pronounced changes in the small-bowel but with fluid-filled loops of bowel throughout, no evidence of obstruction with normal appearance of the appendix. 2. Moderate to severe hepatic steatosis. Electronically Signed   By: Zetta Bills M.D.   On: 12/05/2021 14:30   ? ?PROCEDURES: ? ?Critical Care performed: None. ? ?Procedures ? ? ? ?MEDICATIONS ORDERED IN ED: ?Medications  ?ondansetron (ZOFRAN) injection 4 mg (0 mg Intravenous Hold 12/05/21 1403)  ?iohexol (OMNIPAQUE) 300 MG/ML solution 100 mL (100 mLs Intravenous Contrast Given 12/05/21 1348)  ?sodium chloride 0.9 % bolus 1,000 mL (0 mLs Intravenous Stopped 12/05/21 1518)  ?ibuprofen (ADVIL) tablet 600 mg (600 mg Oral Given 12/05/21 1425)  ?dicyclomine (BENTYL) capsule 10 mg (10 mg Oral Given 12/05/21 1704)  ? ? ? ?IMPRESSION / MDM / ASSESSMENT AND PLAN /  ED COURSE  ?I reviewed the triage vital signs and the nursing notes. ?             ?               ? ? ?Differential diagnosis includes, but is not limited to, gastroenteritis, C. difficile, appendicitis, cholangitis, cholecystitis, cholelithiasis, cystitis, pyelonephritis. ? ?ED Course ?Patient appears well.  Vital signs within normal limits.  NAD.  Currently endorsing mild pain.  We will go and treat with ibuprofen. ? ?CBC shows no evidence of leukocytosis.  No anemia present.  CMP shows no remarkable evidence of electrolyte abnormalities, transaminitis, or kidney injury. ? ?Lipase unremarkable at 28 ? ?CT abdomen shows evidence of gastroenteritis.  Given the patient's endorsement of multiple recent antibiotics over the past month, will test for C. difficile. ? ?Urinalysis shows no evidence of infection. ? ?Assessment/Plan ?Presentation consistent with gastroenteritis.  C. difficile PCR negative.  Currently pending stool panel, though unlikely to change management at this time.  Treated here with dicyclomine and ondansetron.  We will provide him with outpatient prescriptions for these medications.  Advised him to follow-up with his primary care  provider in the next few days to ensure no acute worsening of symptoms.  We will plan to discharge. ? ?Considered admission for this patient, but given his stable vitals, unremarkable work-up, he is unlikely to benefit. ? ?Patient was provided with anticipatory guidance, return precautions, and educational material. Encouraged the patient to return to the emergency department at any time if they begin to experience any new or worsening symptoms.  ? ?  ? ? ?FINAL CLINICAL IMPRESSION(S) / ED DIAGNOSES  ? ?Final diagnoses:  ?Gastroenteritis  ? ? ? ?Rx / DC Orders  ? ?ED Discharge Orders   ? ?      Ordered  ?  dicyclomine (BENTYL) 10 MG capsule  3 times daily before meals & bedtime       ? 12/05/21 1649  ?  ondansetron (ZOFRAN-ODT) 4 MG disintegrating tablet  Every 8 hours PRN        ? 12/05/21 1649  ? ?  ?  ? ?  ? ? ? ?Note:  This document was prepared using Dragon voice recognition software and may include unintentional dictation errors. ?  ?Teodoro Spray, [place] ?12/05/21 1954 ? ?  ?

## 2021-12-05 NOTE — Discharge Instructions (Addendum)
-  You may take dicyclomine and ondansetron as needed for abdominal discomfort/nausea. ?-You may treat pain with Tylenol as needed. ?-Follow-up with your primary care provider to further evaluate the results of your stool PCR. ?-Return to the emergency department anytime if you begin to experience any new or worsening symptoms. ?

## 2021-12-05 NOTE — ED Triage Notes (Signed)
Pt comes with c/o right upper abdominal pain. Pt states this has been on going. Pt states nausea, diarrhea and body aches. ?

## 2021-12-31 ENCOUNTER — Other Ambulatory Visit: Payer: Self-pay | Admitting: Family Medicine

## 2021-12-31 ENCOUNTER — Ambulatory Visit
Admission: RE | Admit: 2021-12-31 | Discharge: 2021-12-31 | Disposition: A | Discharge status: Home / Self Care | Payer: Managed Care, Other (non HMO) | Source: Ambulatory Visit | Attending: Family Medicine | Admitting: Family Medicine

## 2021-12-31 DIAGNOSIS — M7989 Other specified soft tissue disorders: Secondary | ICD-10-CM | POA: Diagnosis present

## 2021-12-31 DIAGNOSIS — S8001XA Contusion of right knee, initial encounter: Secondary | ICD-10-CM | POA: Insufficient documentation

## 2023-02-05 ENCOUNTER — Ambulatory Visit: Payer: Managed Care, Other (non HMO)

## 2023-02-05 DIAGNOSIS — Z83719 Family history of colon polyps, unspecified: Secondary | ICD-10-CM

## 2023-02-05 DIAGNOSIS — Z1211 Encounter for screening for malignant neoplasm of colon: Secondary | ICD-10-CM

## 2023-02-05 DIAGNOSIS — K5730 Diverticulosis of large intestine without perforation or abscess without bleeding: Secondary | ICD-10-CM

## 2023-02-05 DIAGNOSIS — K640 First degree hemorrhoids: Secondary | ICD-10-CM

## 2023-04-05 IMAGING — US US EXTREM LOW VENOUS*R*
1 series · 13 of 24 positions shown · non-contrast
Comparison: None.

CLINICAL DATA: Right lower extremity swelling and pain for few days

EXAM:
RIGHT LOWER EXTREMITY VENOUS DOPPLER ULTRASOUND
TECHNIQUE: Gray-scale sonography with graded compression, as well as color
Doppler and duplex ultrasound were performed to evaluate the lower
extremity deep venous systems from the level of the common femoral
vein and including the common femoral, femoral, profunda femoral,
popliteal and calf veins including the posterior tibial, peroneal
and gastrocnemius veins when visible. Spectral Doppler was utilized
to evaluate flow at rest and with distal augmentation maneuvers in
the common femoral, femoral and popliteal veins.

[Series 1: us extrem low venous*right* · 0.08mm/px · 13 of 49 slices shown]
[im 1/49]
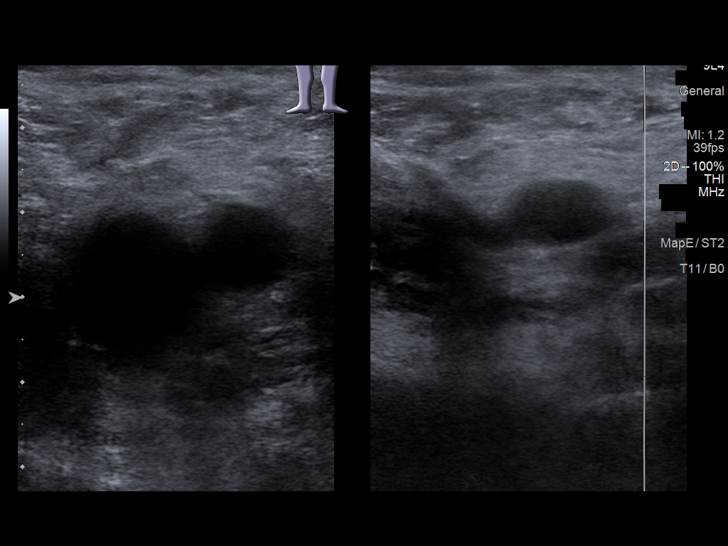
[im 5/49]
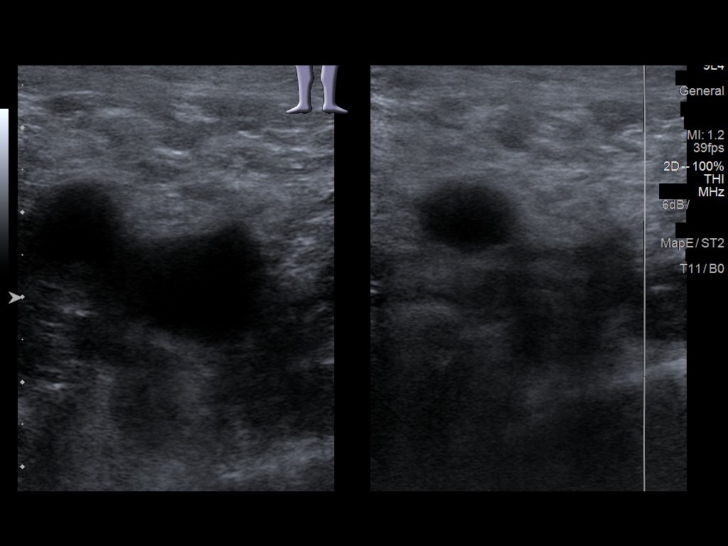
[im 9/49]
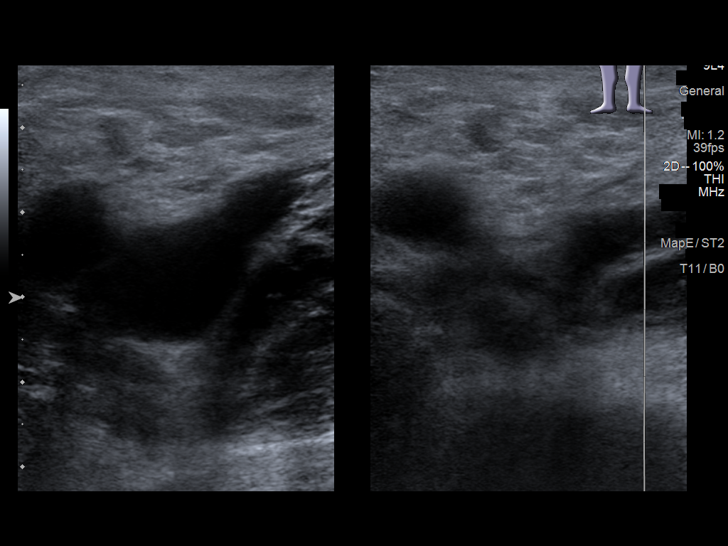
[im 13/49]
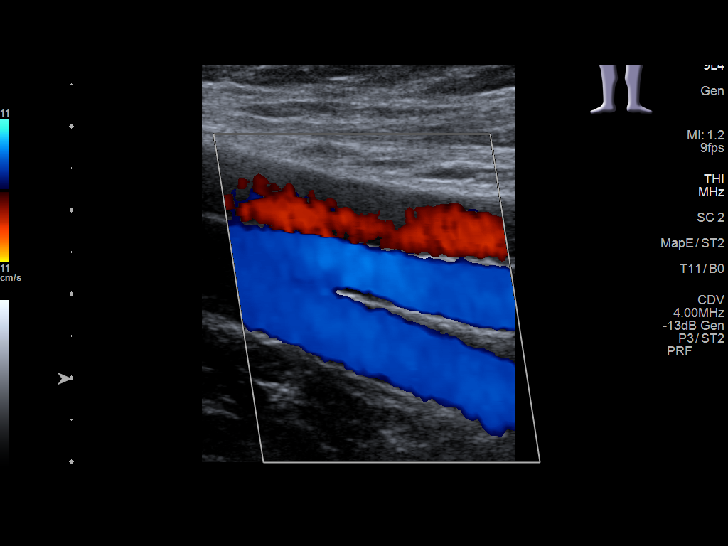
[im 17/49]
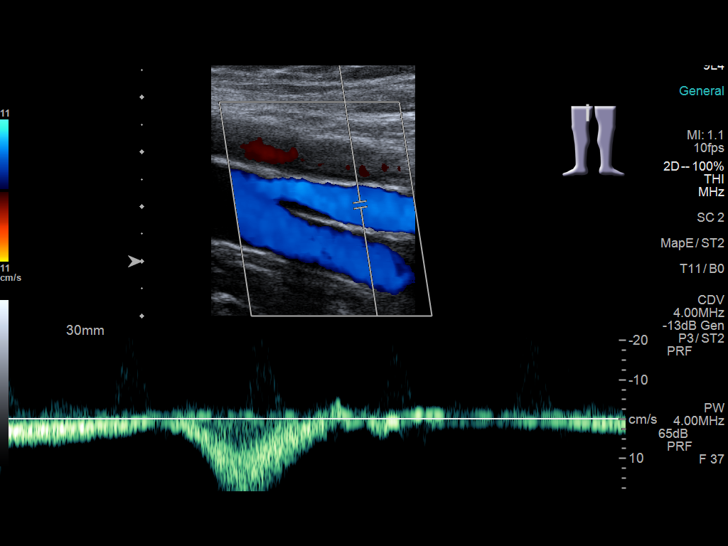
[im 21/49]
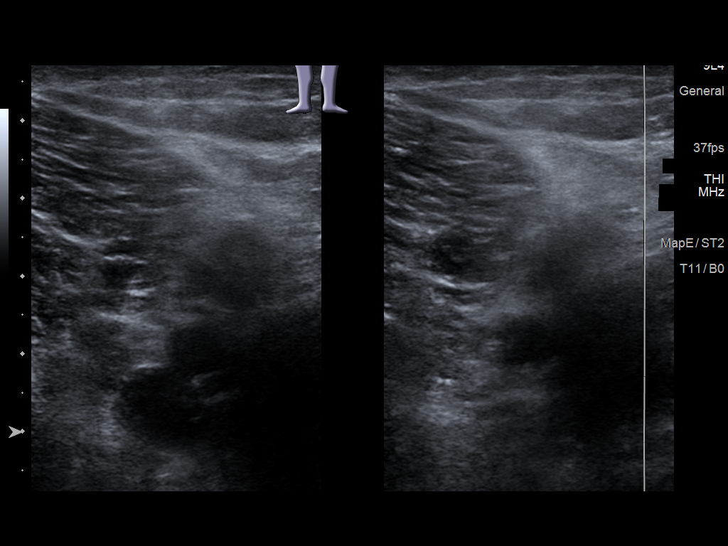
[im 26/49]
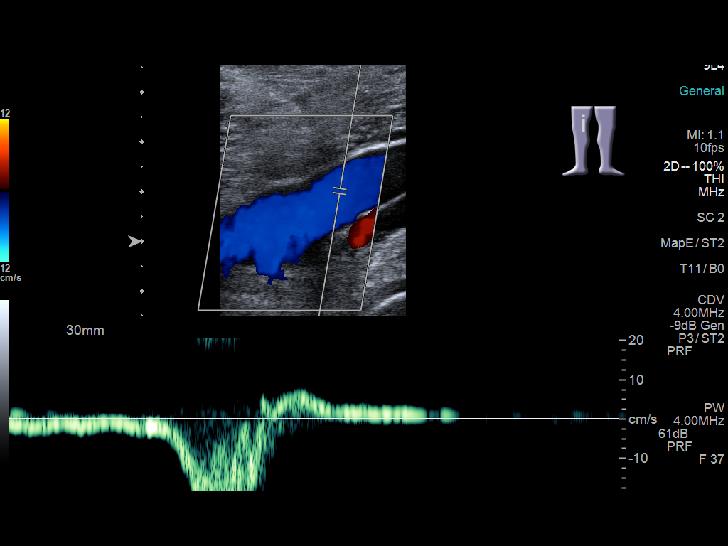
[im 28/49]
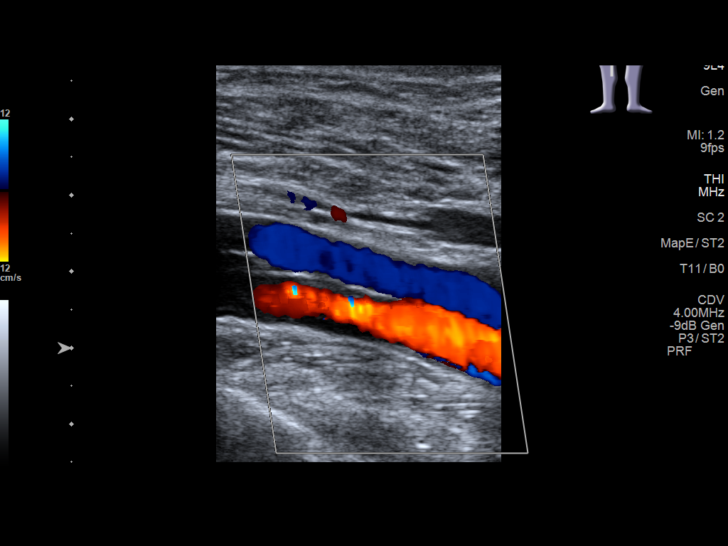
[im 32/49]
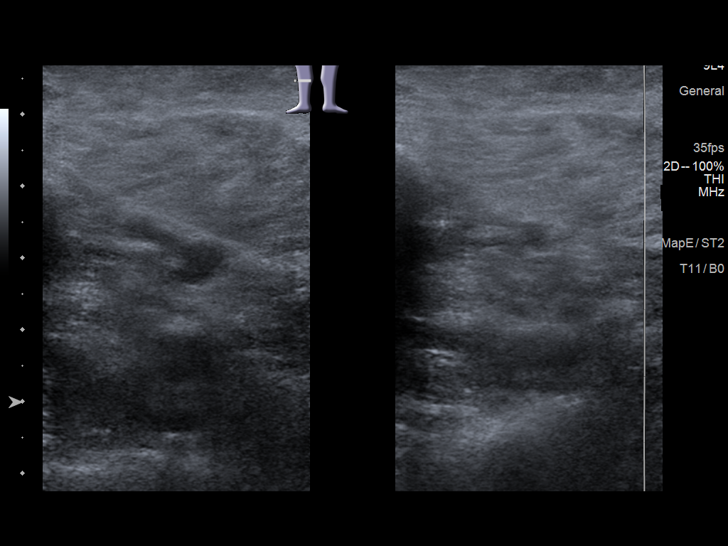
[im 36/49]
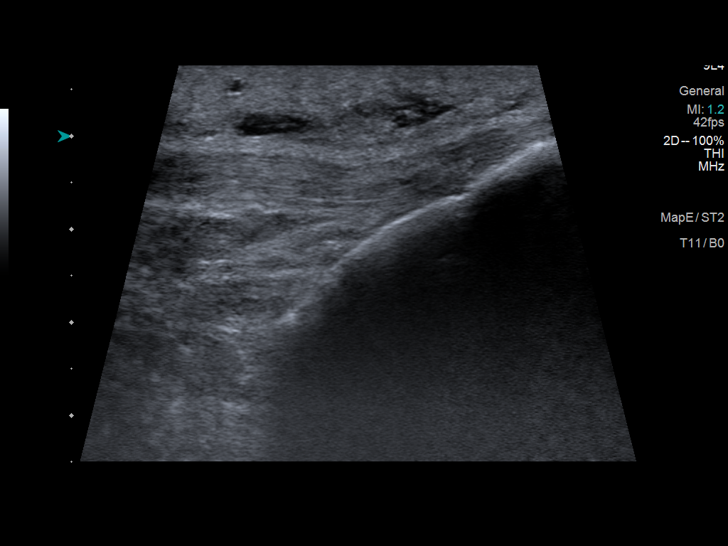
[im 40/49]
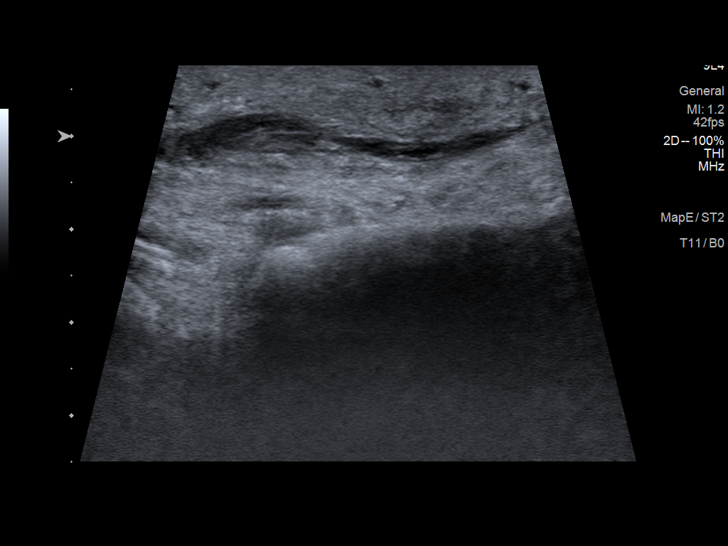
[im 44/49]
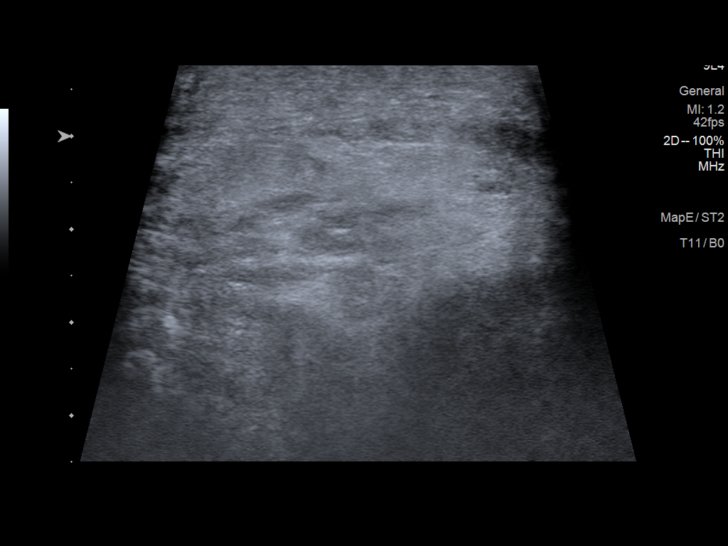
[im 49/49]
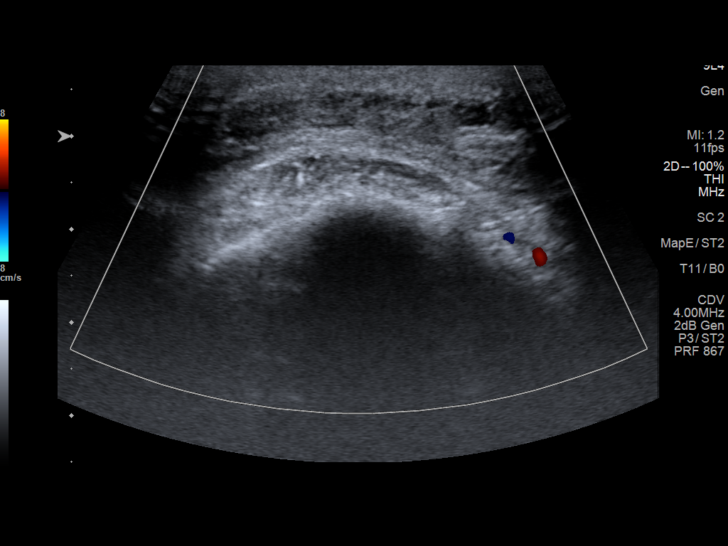

[13 of 24 positions shown; findings below may reference images not displayed]

FINDINGS: Contralateral Common Femoral Vein: Respiratory phasicity is normal
and symmetric with the symptomatic side. No evidence of thrombus.
Normal compressibility.

Common Femoral Vein: No evidence of thrombus. Normal
compressibility, respiratory phasicity and response to augmentation.

Saphenofemoral Junction: No evidence of thrombus. Normal
compressibility and flow on color Doppler imaging.

Profunda Femoral Vein: No evidence of thrombus. Normal
compressibility and flow on color Doppler imaging.

Femoral Vein: No evidence of thrombus. Normal compressibility,
respiratory phasicity and response to augmentation.

Popliteal Vein: No evidence of thrombus. Normal compressibility,
respiratory phasicity and response to augmentation.

Calf Veins: No evidence of thrombus. Normal compressibility and flow
on color Doppler imaging.

Other Findings:  Lower extremity subcutaneous edema noted
IMPRESSION: No evidence of deep venous thrombosis.

## 2024-04-14 NOTE — Progress Notes (Signed)
 ENCOUNTER: Patient Class :No patient class for patient encounter Department: [facility] CLINIC [address] [place] [address]  PATIENT: Patient Demographics      Name Patient ID SSN Gender Identity Birth Date   Rally, Ouch [id number] Male 13-Jun-2071 (53 yrs)          Address Phone Email       [address] [place] [address] [phone] [email or web address]            [facility] Caucasian/White             Reg Status PCP Date Last Verified Next Review Date     ELAPSED Auston Reyes BIRCH [id number] 02/29/24 03/30/24           Marital Status Religion Language       Married Unknown-Patient Declined English              EMERGENCY CONTACT: Name Relationship Lgl Grd Work Administrator, sports  1. SHAFT, CORIGLIANO Spouse    [phone]    GUARANTOR: There is no guarantor information entered for this encounter.  COVERAGE: Primary Visit Coverage      Payer Plan Group Number Group Name Payer Phone Plan Phone   No coverage found                Secondary Visit Coverage      Payer Plan Group Number Group Name Payer Phone Plan Phone   No coverage found                Primary Coverage      Payer Plan Group Number Group Name Payer Phone Plan Phone   [facility] OPEN ACCESS POS [id number] [place], [place].  [phone]           Primary Subscriber      Subscriber ID Subscriber Name Subscriber [facility] Subscriber Address   [id number] Salmela,Mitchel P [id number] [address]      [place], [place] [address]           Secondary Coverage      Payer Plan Group Number Group Name Payer Phone Plan Phone   No coverage found

## 2024-04-27 NOTE — Progress Notes (Signed)
 05/04/24 1:42 PM   Corey Henderson 1971-03-26 [id number]  CC: f/u mens health   HPI: 53 year old male here for routine mens health check  Previously seen in this clinic 4-5 years+ ago   Primary issues today, minor daytime urgency Drinks 1 cup of coffee and up to 1-2L tea every day No nocturia, no obstructive symptoms, mild bother  Also discussed ED - tried ~50mg  sildenafil in the past, medium efficacy but bad headaches Inquired about Cialis  which he tried before with good effect  Previously on TRT - he did not like, does not want to revisit  Previous PSA's:     0.3 ng/mL on 08/16/2013     0.4 ng/mL on 02/27/2014     0.3 ng/mL on 09/17/2014     0.3 in 2017     0.25 in 2025  Hx of CaP in maternal grandfather, as well as 4-5 maternal great uncles  PMH: Past Medical History:  Diagnosis Date   Benign enlargement of prostate    Erectile dysfunction    Erectile dysfunction    Erectile dysfunction    Hemorrhoids    HLD (hyperlipidemia)    Hypogonadism in male    Urinary hesitancy     Surgical History: Past Surgical History:  Procedure Laterality Date   Bicep surgery Right 03/12/2015   bicep surgery Left    11/2015   leg and ankle fracture  2005   SHOULDER SURGERY Right 1994    Family History: Family History  Problem Relation Age of Onset   Prostate cancer Maternal Grandfather    Diabetes Mellitus II Unknown    Hypertension Unknown    Prostate cancer Unknown        x6 Great uncles mom side   Kidney disease Neg Hx     Social History:  reports that he has never smoked. He has quit using smokeless tobacco. He reports that he does not drink alcohol and does not use drugs.  Physical Exam: BP 121/83 (BP Location: Left Arm, Patient Position: Sitting, Cuff Size: Large)   Pulse 94   Ht 5' 11 (1.803 m)   Wt 230 lb 6.4 oz (104.5 kg)   BMI 32.13 kg/m    Constitutional:  Alert and oriented, No acute distress. Cardiovascular: No clubbing, cyanosis, or  edema. Respiratory: Normal respiratory effort, no increased work of breathing. GI: Abdomen is soft, nontender, nondistended, no abdominal masses Skin: No rashes, bruises or suspicious lesions. Neurologic: Grossly intact, no focal deficits, moving all 4 extremities. Psychiatric: Normal mood and affect.  Laboratory Data: PSA 0.25 (June 2025)   Latest Reference Range & Units 06/16/17 09:34  Testosterone  264 - 916 ng/dL 558    Pertinent Imaging: N/A   Assessment & Plan:    Hypogonadism in male -     Urinalysis, Complete -     Bladder Scan (Post Void Residual) in office  Encounter for screening prostate specific antigen (PSA) measurement Assessment & Plan: History of normal PSA values  PSA 0.25 in 2025 Fhx of CaP in maternal grandfathers + several great uncles   Continue routine screening via PCM. Probably okay to increase screening interval to q2 year PSA   Other male erectile dysfunction Assessment & Plan: SEs with 50mg  sildenafil, he did not try 100mg  as prescribed by prior doc   We reviewed the basic tenets of erectile dysfunction management, including normal erectile physiology, common contributing factors, and the spectrum of therapeutic options. Conservative measures such as lifestyle modification, optimization of comorbid  conditions, and avoidance of exacerbating medications were discussed. Pharmacologic options including PDE5 inhibitors, intracavernosal or intraurethral therapies, and vacuum erection devices were reviewed, as well as surgical approaches such as penile prosthesis implantation. All questions were answered.   - trial of 20mg  Cialis  PRN - reviewed SEs (which are often shared among drug class)  - ICI may be next step - Also consider revisiting prior low T diagnosis, may need to recheck if he has interest. He was not today   BPH with obstruction/lower urinary tract symptoms Assessment & Plan: Mild daytime urinary urgency Significant caffeine / tea  consumption  PVR = 0cc today  Fluid intake and caffeine likely his primary issue. I did review male LUTS and BPH management in general. I would have him moderate tea consumption first and reassess. If symptoms progress we can move down BPH pathway with IPSS, uroflow/PVR and trial of an alpha blocker   Other orders -     Tadalafil ; Take 1 tablet (20 mg total) by mouth daily as needed for erectile dysfunction.  Dispense: 8 tablet; Refill: 2      Penne Skye, MD 05/04/2024  [facility] [address], [address] [place], [place] [address] [phone]

## 2024-05-04 ENCOUNTER — Ambulatory Visit: Admitting: Urology

## 2024-05-04 VITALS — BP 121/83 | HR 94 | Ht 71.0 in | Wt 230.4 lb

## 2024-05-04 DIAGNOSIS — E291 Testicular hypofunction: Secondary | ICD-10-CM | POA: Diagnosis not present

## 2024-05-04 DIAGNOSIS — N401 Enlarged prostate with lower urinary tract symptoms: Secondary | ICD-10-CM | POA: Diagnosis not present

## 2024-05-04 DIAGNOSIS — N528 Other male erectile dysfunction: Secondary | ICD-10-CM

## 2024-05-04 DIAGNOSIS — N138 Other obstructive and reflux uropathy: Secondary | ICD-10-CM

## 2024-05-04 DIAGNOSIS — Z125 Encounter for screening for malignant neoplasm of prostate: Secondary | ICD-10-CM

## 2024-05-04 LAB — URINALYSIS, COMPLETE
Bilirubin, UA: NEGATIVE
Glucose, UA: NEGATIVE
Ketones, UA: NEGATIVE
Leukocytes,UA: NEGATIVE
Nitrite, UA: NEGATIVE
Protein,UA: NEGATIVE
RBC, UA: NEGATIVE
Specific Gravity, UA: 1.025 (ref 1.005–1.030)
Urobilinogen, Ur: 0.2 mg/dL (ref 0.2–1.0)
pH, UA: 6 (ref 5.0–7.5)

## 2024-05-04 LAB — BLADDER SCAN AMB NON-IMAGING

## 2024-05-04 LAB — MICROSCOPIC EXAMINATION: Bacteria, UA: NONE SEEN

## 2024-05-04 MED ORDER — TADALAFIL 20 MG PO TABS: 20.0000 mg | ORAL_TABLET | Freq: Every day | ORAL | 2 refills | Status: AC | PRN

## 2024-05-04 MED ORDER — TADALAFIL 20 MG PO TABS: 20.0000 mg | ORAL_TABLET | Freq: Every day | ORAL | 2 refills | Status: DC | PRN

## 2024-05-04 NOTE — Assessment & Plan Note (Signed)
 SEs with 50mg  sildenafil, he did not try 100mg  as prescribed by prior doc   We reviewed the basic tenets of erectile dysfunction management, including normal erectile physiology, common contributing factors, and the spectrum of therapeutic options. Conservative measures such as lifestyle modification, optimization of comorbid conditions, and avoidance of exacerbating medications were discussed. Pharmacologic options including PDE5 inhibitors, intracavernosal or intraurethral therapies, and vacuum erection devices were reviewed, as well as surgical approaches such as penile prosthesis implantation. All questions were answered.   - trial of 20mg  Cialis  PRN - reviewed SEs (which are often shared among drug class)  - ICI may be next step - Also consider revisiting prior low T diagnosis, may need to recheck if he has interest. He was not today

## 2024-05-04 NOTE — Assessment & Plan Note (Signed)
 History of normal PSA values  PSA 0.25 in 2025 Fhx of CaP in maternal grandfathers + several great uncles   Continue routine screening via PCM. Probably okay to increase screening interval to q2 year PSA

## 2024-05-04 NOTE — Assessment & Plan Note (Addendum)
 Mild daytime urinary urgency Significant caffeine / tea consumption  PVR = 0cc today  Fluid intake and caffeine likely his primary issue. I did review male LUTS and BPH management in general. I would have him moderate tea consumption first and reassess. If symptoms progress we can move down BPH pathway with IPSS, uroflow/PVR and trial of an alpha blocker

## 2024-08-16 ENCOUNTER — Emergency Department

## 2024-08-16 ENCOUNTER — Emergency Department
Admission: EM | Admit: 2024-08-16 | Discharge: 2024-08-16 | Disposition: A | Discharge status: Home / Self Care | Attending: Emergency Medicine | Admitting: Emergency Medicine

## 2024-08-16 ENCOUNTER — Encounter: Payer: Self-pay | Admitting: Emergency Medicine

## 2024-08-16 DIAGNOSIS — R079 Chest pain, unspecified: Secondary | ICD-10-CM | POA: Diagnosis present

## 2024-08-16 DIAGNOSIS — R0789 Other chest pain: Secondary | ICD-10-CM | POA: Diagnosis not present

## 2024-08-16 LAB — CBC
HCT: 45 % (ref 39.0–52.0)
Hemoglobin: 16.1 g/dL (ref 13.0–17.0)
MCH: 30.9 pg (ref 26.0–34.0)
MCHC: 35.8 g/dL (ref 30.0–36.0)
MCV: 86.4 fL (ref 80.0–100.0)
Platelets: 177 K/uL (ref 150–400)
RBC: 5.21 MIL/uL (ref 4.22–5.81)
RDW: 11.5 % (ref 11.5–15.5)
WBC: 5.9 K/uL (ref 4.0–10.5)
nRBC: 0 % (ref 0.0–0.2)

## 2024-08-16 LAB — BASIC METABOLIC PANEL WITH GFR
Anion gap: 12 (ref 5–15)
BUN: 11 mg/dL (ref 6–20)
CO2: 26 mmol/L (ref 22–32)
Calcium: 9.1 mg/dL (ref 8.9–10.3)
Chloride: 100 mmol/L (ref 98–111)
Creatinine, Ser: 0.96 mg/dL (ref 0.61–1.24)
GFR, Estimated: >60 mL/min (ref >60)
Glucose, Bld: 109 mg/dL — ABNORMAL HIGH (ref 70–99)
Potassium: 4.1 mmol/L (ref 3.5–5.1)
Sodium: 138 mmol/L (ref 135–145)

## 2024-08-16 LAB — TROPONIN T, HIGH SENSITIVITY
Troponin T High Sensitivity: <15 ng/L (ref 0–19)
Troponin T High Sensitivity: <15 ng/L (ref 0–19)

## 2024-08-16 NOTE — ED Triage Notes (Signed)
 Patient sent over from [facility] for chest pain. Patient states he has been having left arm pain intermittently for a few weeks. This morning began what felt like indigestion that radiates into his back.

## 2024-08-16 NOTE — ED Provider Notes (Addendum)
 [facility] Provider Note    Event Date/Time   First MD Initiated Contact with Patient 08/16/24 1215     (approximate)   History   Chief Complaint: Chest Pain and Arm Pain   HPI  Corey Henderson is a 53 y.o. male with past history of hyperlipidemia, BPH who comes ED complaining of chest pain, left central chest, nonradiating.  Feels like burning and tightness.  No shortness of breath.  Not exertional, not pleuritic, no vomiting or diaphoresis.  Denies any recent exertional symptoms.  Has been admitted to occasional strenuous activity without symptoms.  Also complains of left arm pain which is aggravated by manual activity such as building a wooden sled in his workshop        Past Medical History:  Diagnosis Date   Benign enlargement of prostate    Erectile dysfunction    Erectile dysfunction    Erectile dysfunction    Hemorrhoids    HLD (hyperlipidemia)    Hypogonadism in male    Urinary hesitancy     Current Outpatient Rx   Order #: [id number] Class: Historical Med   Order #: [id number] Class: Normal   Order #: [id number] Class: Historical Med   Order #: [id number] Class: Historical Med   Order #: [id number] Class: Historical Med   Order #: [id number] Class: Historical Med   Order #: [id number] Class: Normal   Order #: [id number] Class: Historical Med   Order #: [id number] Class: Normal   Order #: [id number] Class: Historical Med    Past Surgical History:  Procedure Laterality Date   Bicep surgery Right 03/12/2015   bicep surgery Left    11/2015   leg and ankle fracture  2005   SHOULDER SURGERY Right 1994    Physical Exam   Triage Vital Signs: ED Triage Vitals  Encounter Vitals Group     BP 08/16/24 1105 134/89     Girls Systolic BP Percentile --      Girls Diastolic BP Percentile --      Boys Systolic BP Percentile --      Boys Diastolic BP Percentile --      Pulse Rate 08/16/24 1105 76     Resp 08/16/24 1105 18     Temp 08/16/24 1105 97.7  F (36.5 C)     Temp Source 08/16/24 1105 Oral     SpO2 08/16/24 1105 98 %     Weight 08/16/24 1102 230 lb (104.3 kg)     Height 08/16/24 1102 5' 11 (1.803 m)     Head Circumference --      Peak Flow --      Pain Score 08/16/24 1102 3     Pain Loc --      Pain Education --      Exclude from Growth Chart --     Most recent vital signs: Vitals:   08/16/24 1105 08/16/24 1459  BP: 134/89 136/84  Pulse: 76 80  Resp: 18 17  Temp: 97.7 F (36.5 C) 98.1 F (36.7 C)  SpO2: 98% 99%    General: Awake, no distress.  CV:  Good peripheral perfusion.  Regular rate rhythm, normal distal pulses Resp:  Normal effort.  Clear lungs Abd:  No distention.  Soft nontender Other:  No chest wall tenderness.  Normal range of motion in the left arm   ED Results / Procedures / Treatments   Labs (all labs ordered are listed, but only abnormal results are displayed) Labs Reviewed  BASIC METABOLIC PANEL WITH GFR - Abnormal; Notable  for the following components:      Result Value   Glucose, Bld 109 (*)    All other components within normal limits  CBC  TROPONIN T, HIGH SENSITIVITY  TROPONIN T, HIGH SENSITIVITY     EKG Interpreted by me Sinus rhythm rate of 77.  Normal axis intervals QRS ST segments T waves   RADIOLOGY Chest x-ray interpreted by me, appears normal.  Radiology report reviewed   PROCEDURES:  Procedures   MEDICATIONS ORDERED IN ED: Medications - No data to display   IMPRESSION / MDM / ASSESSMENT AND PLAN / ED COURSE  I reviewed the triage vital signs and the nursing notes.  DDx: NSTEMI, pneumonia, pleural effusion, electrolyte derangement, anemia.  Doubt PE or dissection.  Patient's presentation is most consistent with acute presentation with potential threat to life or bodily function.  Patient presents with atypical chest pain, currently resolved.  Vital signs, EKG chest x-ray exam all reassuring.  Pain is not reproducible.  Initial labs are normal.  Will  repeat troponin, if trend is stable he will be suitable for outpatient follow-up with cardiology referral.       FINAL CLINICAL IMPRESSION(S) / ED DIAGNOSES   Final diagnoses:  Atypical chest pain     Rx / DC Orders   ED Discharge Orders          Ordered    Ambulatory referral to Cardiology       Comments: If you have not heard from the Cardiology office within the next 72 hours please call [phone].   08/16/24 1331             Note:  This document was prepared using Dragon voice recognition software and may include unintentional dictation errors.   Viviann Pastor, MD 08/16/24 1331    Viviann Pastor, MD 08/16/24 [phone]

## 2024-08-18 ENCOUNTER — Ambulatory Visit: Attending: Cardiology | Admitting: Cardiology

## 2024-08-18 ENCOUNTER — Encounter: Payer: Self-pay | Admitting: Cardiology

## 2024-08-18 VITALS — BP 129/88 | HR 81 | Ht 71.0 in | Wt 224.6 lb

## 2024-08-18 DIAGNOSIS — R072 Precordial pain: Secondary | ICD-10-CM

## 2024-08-18 DIAGNOSIS — E782 Mixed hyperlipidemia: Secondary | ICD-10-CM

## 2024-08-18 MED ORDER — EZETIMIBE 10 MG PO TABS: 10.0000 mg | ORAL_TABLET | Freq: Every day | ORAL | 3 refills | Status: AC

## 2024-08-18 MED ORDER — METOPROLOL TARTRATE 100 MG PO TABS: ORAL_TABLET | ORAL | 0 refills | Status: AC

## 2024-08-18 NOTE — Patient Instructions (Signed)
 Medication Instructions:  - take one 100 mg metoprolol two hours prior to cardiac CT  - START zetia 10 mg daily   *If you need a refill on your cardiac medications before your next appointment, please call your pharmacy*  Lab Work: No labs ordered today  If you have labs (blood work) drawn today and your tests are completely normal, you will receive your results only by: MyChart Message (if you have MyChart) OR A paper copy in the mail If you have any lab test that is abnormal or we need to change your treatment, we will call you to review the results.  Testing/Procedures: Your physician has requested that you have an echocardiogram. Echocardiography is a painless test that uses sound waves to create images of your heart. It provides your doctor with information about the size and shape of your heart and how well your hearts chambers and valves are working.   You may receive an ultrasound enhancing agent through an IV if needed to better visualize your heart during the echo. This procedure takes approximately one hour.  There are no restrictions for this procedure.  This will take place at [address] [facility] [address]) [address], [place] [address]  Please note: We ask at that you not bring children with you during ultrasound (echo/ vascular) testing. Due to room size and safety concerns, children are not allowed in the ultrasound rooms during exams. Our front office staff cannot provide observation of children in our lobby area while testing is being conducted. An adult accompanying a patient to their appointment will only be allowed in the ultrasound room at the discretion of the ultrasound technician under special circumstances. We apologize for any inconvenience.   Your cardiac CT will be scheduled at one of the below locations:    [facility] [address] [place], [place] [address] [phone]  OR   [facility] [address]  [place], [place] [address]  If scheduled at the Heart and Vascular Tower at [address], please enter the parking lot using the [address] entrance and use the FREE valet service at the patient drop-off area. Enter the building and check-in with registration on the main floor.  If scheduled at [facility], please arrive to the Heart and Vascular Center 15 mins early for check-in and test prep.  There is spacious parking and easy access to the radiology department from the [facility] entrance. Please enter here and check-in with the desk attendant.   Please follow these instructions carefully (unless otherwise directed):  An IV will be required for this test and Nitroglycerin will be given.  Hold all erectile dysfunction medications at least 3 days (72 hrs) prior to test. (Ie viagra, cialis , sildenafil, tadalafil , etc)   On the Night Before the Test: Be sure to Drink plenty of water. Do not consume any caffeinated/decaffeinated beverages or chocolate 12 hours prior to your test. Do not take any antihistamines 12 hours prior to your test.  On the Day of the Test: Drink plenty of water until 1 hour prior to the test. Do not eat any food 1 hour prior to test. You may take your regular medications prior to the test.  Take metoprolol (Lopressor) two hours prior to test. If you take Furosemide/Hydrochlorothiazide/Spironolactone/Chlorthalidone, please HOLD on the morning of the test. Patients who wear a continuous glucose monitor MUST remove the device prior to scanning.  After the Test: Drink plenty of water. After receiving IV contrast, you may experience a mild flushed feeling. This is normal. On occasion, you may experience a mild rash up to 24 hours after the test. This is not dangerous. If this occurs, you can take Benadryl  25 mg, Zyrtec, Claritin, or Allegra and increase your fluid intake. (Patients taking Tikosyn  should avoid Benadryl , and may take Zyrtec, Claritin, or Allegra) If you experience trouble breathing, this can be serious. If it is severe call [phone] IMMEDIATELY. If it is mild, please call our office.  We will call to schedule your test 2-4 weeks out understanding that some insurance companies will need an authorization prior to the service being performed.   For more information and frequently asked questions, please visit our website : [email or web address]  For non-scheduling related questions, please contact the cardiac imaging nurse navigator should you have any questions/concerns: Cardiac Imaging Nurse Navigators Direct Office Dial: [phone]   For scheduling needs, including cancellations and rescheduling, please call Brittany, [phone].   Follow-Up: At [facility], you and your health needs are our priority.  As part of our continuing mission to provide you with exceptional heart care, our providers are all part of one team.  This team includes your primary Cardiologist (physician) and Advanced Practice Providers or APPs (Physician Assistants and Nurse Practitioners) who all work together to provide you with the care you need, when you need it.  Your next appointment:   3 month(s)  Provider:   You may see Dr Darliss or one of the following Advanced Practice Providers on your designated Care Team:   Lonni Meager, NP Lesley Maffucci, PA-C Bernardino Bring, PA-C Cadence Doctor Phillips, PA-C Tylene Lunch, NP Barnie Hila, NP    We recommend signing up for the patient portal called MyChart.  Sign up information is provided on this After Visit Summary.  MyChart is used to connect with patients for Virtual Visits (Telemedicine).  Patients are able to view lab/test results, encounter notes, upcoming appointments, etc.  Non-urgent messages can be sent to your provider as well.   To learn more about what you can do with MyChart, go to [email or web address].

## 2024-08-18 NOTE — Progress Notes (Signed)
 Cardiology Office Note:    Date:  08/18/2024   ID:  Corey Henderson, DOB 08-18-1971, MRN [id number]  PCP:  Auston Reyes BIRCH, MD   [facility] Providers Cardiologist:  None     Referring MD: Viviann Pastor, MD   Chief Complaint  Patient presents with   Chest Pain    Patient states that he experiences some dullness in his chest. Meds reviewed.    Corey Henderson is a 53 y.o. male who is being seen today for the evaluation of chest pain at the request of Viviann Pastor, MD.   History of Present Illness:    NICOLIS Henderson is a 53 y.o. male with a hx of hyperlipidemia, ED presenting with chest pain.  States having chest pain 2 days ago prompting him to go to the ED.  Workup with EKG and troponin was unrevealing.  Chest pain occurred in the setting of being stressed at work.  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  Overall states feeling well currently.  Has noticed left arm pain ongoing over the past 1-2 weeks.  Has a history of statin intolerance, has tried Lipitor, Crestor in the past but stopped due to muscle aches.  Grandfather on dad side had a heart attack in his 33s.  Denies any immediate family history of heart disease.  Past Medical History:  Diagnosis Date   Benign enlargement of prostate    Erectile dysfunction    Erectile dysfunction    Erectile dysfunction    Hemorrhoids    HLD (hyperlipidemia)    Hypogonadism in male    Urinary hesitancy     Past Surgical History:  Procedure Laterality Date   Bicep surgery Right 03/12/2015   bicep surgery Left    11/2015   leg and ankle fracture  2005   SHOULDER SURGERY Right 1994    Current Medications: Active Medications[1]   Allergies:   Statins, Sulfa antibiotics, and Hydrocodone-acetaminophen   Social History   Socioeconomic History   Marital status: Married    Spouse name: Not on file   Number of children: Not on file   Years of education:  Not on file   Highest education level: Not on file  Occupational History   Not on file  Tobacco Use   Smoking status: Never   Smokeless tobacco: Former  Substance and Sexual Activity   Alcohol use: No    Alcohol/week: 0.0 standard drinks of alcohol   Drug use: No   Sexual activity: Not on file  Other Topics Concern   Not on file  Social History Narrative   Not on file   Social Drivers of Health   Tobacco Use: Medium Risk (08/18/2024)   Patient History    Smoking Tobacco Use: Never    Smokeless Tobacco Use: Former    Passive Exposure: Not on Actuary Strain: Low Risk  (06/05/2024)   Received from [facility]   Overall Financial Resource Strain (CARDIA)    Difficulty of Paying Living Expenses: Not hard at all  Food Insecurity: No Food Insecurity (06/05/2024)   Received from [facility]   Epic    Within the past 12 months, you worried that your food would run out before you got the money to buy more.: Never true    Within the past 12 months, the food you bought just didn't last and you didn't have money to get more.:  Never true  Transportation Needs: No Transportation Needs (06/05/2024)   Received from [facility] - Transportation    In the past 12 months, has lack of transportation kept you from medical appointments or from getting medications?: No    Lack of Transportation (Non-Medical): No  Physical Activity: Not on file  Stress: Not on file  Social Connections: Not on file  Depression (EYV7-0): Not on file  Alcohol Screen: Not on file  Housing: Unknown (06/05/2024)   Received from [facility]   Epic    In the last 12 months, was there a time when you were not able to pay the mortgage or rent on time?: No    Number of Times Moved in the Last Year: Not on file    At any time in the past 12 months, were you homeless or living in a shelter (including now)?: No  Utilities: Not At  Risk (06/05/2024)   Received from [facility]   Epic    In the past 12 months has the electric, gas, oil, or water company threatened to shut off services in your home?: No  Health Literacy: Not on file     Family History: The patient's family history includes Diabetes Mellitus II in his unknown relative; Hypertension in his unknown relative; Prostate cancer in his maternal grandfather and unknown relative. There is no history of Kidney disease.  ROS:   Please see the history of present illness.     All other systems reviewed and are negative.  EKGs/Labs/Other Studies Reviewed:    The following studies were reviewed today:  EKG Interpretation Date/Time:  Friday August 18 2024 14:08:14 EST Ventricular Rate:  81 PR Interval:  166 QRS Duration:  110 QT Interval:  364 QTC Calculation: 422 R Axis:   -32  Text Interpretation: Normal sinus rhythm Left axis deviation Incomplete right bundle branch block Minimal voltage criteria for LVH, may be normal variant ( Cornell product ) Confirmed by Darliss Rogue ([id number]) on 08/18/2024 2:15:38 PM    Recent Labs: 08/16/2024: BUN 11; Creatinine, Ser 0.96; Hemoglobin 16.1; Platelets 177; Potassium 4.1; Sodium 138  Recent Lipid Panel    Component Value Date/Time   CHOL 215 (H) 10/11/2015 0848   TRIG 254 (H) 10/11/2015 0848   HDL 33 (L) 10/11/2015 0848   CHOLHDL 6.5 (H) 10/11/2015 0848   LDLCALC 131 (H) 10/11/2015 0848   Outside lipid panel 05/22/2024 total cholesterol 252, triglycerides 746, HDL 33.4  Risk Assessment/Calculations:             Physical Exam:    VS:  BP 129/88   Pulse 81   Ht 5' 11 (1.803 m)   Wt 224 lb 9.6 oz (101.9 kg)   SpO2 95%   BMI 31.33 kg/m     Wt Readings from Last 3 Encounters:  08/18/24 224 lb 9.6 oz (101.9 kg)  08/16/24 230 lb (104.3 kg)  05/04/24 230 lb 6.4 oz (104.5 kg)     GEN:  Well nourished, well developed in no acute distress HEENT: Normal NECK: No JVD; No carotid  bruits CARDIAC: RRR, no murmurs, rubs, gallops RESPIRATORY:  Clear to auscultation without rales, wheezing or rhonchi  ABDOMEN: Soft, non-tender, non-distended MUSCULOSKELETAL:  No edema; No deformity  SKIN: Warm and dry NEUROLOGIC:  Alert and oriented x 3 PSYCHIATRIC:  Normal affect   ASSESSMENT:    1. Precordial pain   2. Mixed hyperlipidemia    PLAN:  In order of problems listed above:  Chest pain, associated with stress, consistent with angina.  Obtain echo, obtain coronary CTA. Hyperlipidemia, significantly elevated triglycerides, intolerant to statins.  Start Zetia 10 mg daily.  Consider adding fenofibrate if cholesterol not adequately controlled upon recheck.  Follow-up after cardiac testing.     Medication Adjustments/Labs and Tests Ordered: Current medicines are reviewed at length with the patient today.  Concerns regarding medicines are outlined above.  Orders Placed This Encounter  Procedures   CT CORONARY MORPH W/CTA COR W/SCORE W/CA W/CM &/OR WO/CM   EKG 12-Lead   ECHOCARDIOGRAM COMPLETE   Meds ordered this encounter  Medications   metoprolol tartrate (LOPRESSOR) 100 MG tablet    Sig: TAKE 1 TABLET 2 HR PRIOR TO CARDIAC PROCEDURE    Dispense:  1 tablet    Refill:  0   ezetimibe (ZETIA) 10 MG tablet    Sig: Take 1 tablet (10 mg total) by mouth daily.    Dispense:  90 tablet    Refill:  3    Patient Instructions  Medication Instructions:  - take one 100 mg metoprolol two hours prior to cardiac CT  - START zetia 10 mg daily   *If you need a refill on your cardiac medications before your next appointment, please call your pharmacy*  Lab Work: No labs ordered today  If you have labs (blood work) drawn today and your tests are completely normal, you will receive your results only by: MyChart Message (if you have MyChart) OR A paper copy in the mail If you have any lab test that is abnormal or we need to change your treatment, we will call you to review  the results.  Testing/Procedures: Your physician has requested that you have an echocardiogram. Echocardiography is a painless test that uses sound waves to create images of your heart. It provides your doctor with information about the size and shape of your heart and how well your hearts chambers and valves are working.   You may receive an ultrasound enhancing agent through an IV if needed to better visualize your heart during the echo. This procedure takes approximately one hour.  There are no restrictions for this procedure.  This will take place at [address] [facility], [place] [address]  Please note: We ask at that you not bring children with you during ultrasound (echo/ vascular) testing. Due to room size and safety concerns, children are not allowed in the ultrasound rooms during exams. Our front office staff cannot provide observation of children in our lobby area while testing is being conducted. An adult accompanying a patient to their appointment will only be allowed in the ultrasound room at the discretion of the ultrasound technician under special circumstances. We apologize for any inconvenience.   Your cardiac CT will be scheduled at one of the below locations:    [facility] [address] [place], [place] [address] [phone]  OR   [facility] [address]  [place], [place] [address]  If scheduled at the Heart and Vascular Tower at [address], please enter the parking lot using the [address] entrance and use the FREE valet service at the patient drop-off area. Enter the building and check-in with registration on the main floor.  If scheduled at [facility], please arrive to the Heart and Vascular Center 15 mins early for check-in and test prep.  There is spacious parking and  easy access to the radiology department from the [facility]  entrance. Please enter here and check-in with the desk attendant.   Please follow these instructions carefully (unless otherwise directed):  An IV will be required for this test and Nitroglycerin will be given.  Hold all erectile dysfunction medications at least 3 days (72 hrs) prior to test. (Ie viagra, cialis , sildenafil, tadalafil , etc)   On the Night Before the Test: Be sure to Drink plenty of water. Do not consume any caffeinated/decaffeinated beverages or chocolate 12 hours prior to your test. Do not take any antihistamines 12 hours prior to your test.  On the Day of the Test: Drink plenty of water until 1 hour prior to the test. Do not eat any food 1 hour prior to test. You may take your regular medications prior to the test.  Take metoprolol (Lopressor) two hours prior to test. If you take Furosemide/Hydrochlorothiazide/Spironolactone/Chlorthalidone, please HOLD on the morning of the test. Patients who wear a continuous glucose monitor MUST remove the device prior to scanning.       After the Test: Drink plenty of water. After receiving IV contrast, you may experience a mild flushed feeling. This is normal. On occasion, you may experience a mild rash up to 24 hours after the test. This is not dangerous. If this occurs, you can take Benadryl  25 mg, Zyrtec, Claritin, or Allegra and increase your fluid intake. (Patients taking Tikosyn should avoid Benadryl , and may take Zyrtec, Claritin, or Allegra) If you experience trouble breathing, this can be serious. If it is severe call 911 IMMEDIATELY. If it is mild, please call our office.  We will call to schedule your test 2-4 weeks out understanding that some insurance companies will need an authorization prior to the service being performed.   For more information and frequently asked questions, please visit our website : [email or web address]  For non-scheduling related questions, please contact the cardiac imaging nurse  navigator should you have any questions/concerns: Cardiac Imaging Nurse Navigators Direct Office Dial: [phone]   For scheduling needs, including cancellations and rescheduling, please call Brittany, [phone].   Follow-Up: At [facility], you and your health needs are our priority.  As part of our continuing mission to provide you with exceptional heart care, our providers are all part of one team.  This team includes your primary Cardiologist (physician) and Advanced Practice Providers or APPs (Physician Assistants and Nurse Practitioners) who all work together to provide you with the care you need, when you need it.  Your next appointment:   3 month(s)  Provider:   You may see Dr Darliss or one of the following Advanced Practice Providers on your designated Care Team:   Lonni Meager, NP Lesley Maffucci, PA-C Bernardino Bring, PA-C Cadence Maury, PA-C Tylene Lunch, NP Barnie Hila, NP    We recommend signing up for the patient portal called MyChart.  Sign up information is provided on this After Visit Summary.  MyChart is used to connect with patients for Virtual Visits (Telemedicine).  Patients are able to view lab/test results, encounter notes, upcoming appointments, etc.  Non-urgent messages can be sent to your provider as well.   To learn more about what you can do with MyChart, go to [email or web address].     Signed, Redell Darliss, MD  08/18/2024 3:21 PM    [facility]     [1]  Current Meds  Medication Sig   ezetimibe (ZETIA) 10 MG tablet Take 1 tablet (10 mg  total) by mouth daily.   metoprolol tartrate (LOPRESSOR) 100 MG tablet TAKE 1 TABLET 2 HR PRIOR TO CARDIAC PROCEDURE

## 2024-09-06 ENCOUNTER — Other Ambulatory Visit: Payer: Self-pay | Admitting: Family Medicine

## 2024-09-06 DIAGNOSIS — M5416 Radiculopathy, lumbar region: Secondary | ICD-10-CM

## 2024-09-12 ENCOUNTER — Inpatient Hospital Stay: Admission: RE | Admit: 2024-09-12 | Discharge: 2024-09-12 | Attending: Family Medicine | Admitting: Family Medicine

## 2024-09-12 DIAGNOSIS — M5416 Radiculopathy, lumbar region: Secondary | ICD-10-CM

## 2024-09-13 ENCOUNTER — Telehealth (HOSPITAL_COMMUNITY): Payer: Self-pay | Admitting: *Deleted

## 2024-09-13 NOTE — Telephone Encounter (Signed)
 Reaching out to patient to offer assistance regarding upcoming cardiac imaging study; pt verbalizes understanding of appt date/time, parking situation and where to check in, pre-test NPO status and medications ordered, and verified current allergies; name and call back number provided for further questions should they arise  Corey Brick RN Navigator Cardiac Imaging [facility] [phone] office [phone] cell  Patient to take 100mg  metoprolol tartrate two hours prior to his cardiac CT scan.

## 2024-09-14 ENCOUNTER — Ambulatory Visit
Admission: RE | Admit: 2024-09-14 | Discharge: 2024-09-14 | Disposition: A | Discharge status: Home / Self Care | Source: Ambulatory Visit | Attending: Cardiology | Admitting: Cardiology

## 2024-09-14 ENCOUNTER — Telehealth: Payer: Self-pay | Admitting: Cardiology

## 2024-09-14 DIAGNOSIS — R072 Precordial pain: Secondary | ICD-10-CM | POA: Insufficient documentation

## 2024-09-14 MED ORDER — NITROGLYCERIN 0.4 MG SL SUBL
0.8000 mg | SUBLINGUAL_TABLET | Freq: Once | SUBLINGUAL | Status: AC
  Administered 2024-09-14: 0.8 mg via SUBLINGUAL
  Filled 2024-09-14: qty 25

## 2024-09-14 MED ORDER — IOHEXOL 350 MG/ML SOLN
100.0000 mL | Freq: Once | INTRAVENOUS | Status: AC | PRN
  Administered 2024-09-14: 100 mL via INTRAVENOUS

## 2024-09-14 MED ORDER — METOPROLOL TARTRATE 5 MG/5ML IV SOLN
10.0000 mg | Freq: Once | INTRAVENOUS | Status: DC | PRN
  Filled 2024-09-14: qty 10

## 2024-09-14 MED ORDER — NITROGLYCERIN 0.4 MG SL SUBL
SUBLINGUAL_TABLET | SUBLINGUAL | Status: AC
  Filled 2024-09-14: qty 1

## 2024-09-14 MED ORDER — DILTIAZEM HCL 25 MG/5ML IV SOLN
10.0000 mg | INTRAVENOUS | Status: DC | PRN
  Filled 2024-09-14: qty 5

## 2024-09-14 NOTE — Telephone Encounter (Signed)
 Pt has a CT done today and stated around 4pm today he started feeling discomfort in front of his ear, back of his neck and jaw bone. He'd like to know is that normal. Pt is requesting a callback to be advised.

## 2024-09-14 NOTE — Progress Notes (Signed)
 Patient tolerated cardiac CT well. No incidents. Vital signs stable. Encouraged to drink water  for the remainder of the day. Pt verbalized understanding.

## 2024-09-15 NOTE — Telephone Encounter (Signed)
 Called patient back to check on him this morning after evening call yesterday concerning pain after CTA. Patient stated pain started in head around 3 pm and got some better after 6 pm. This morning he feels a lot better and was wondering if it could be due to medication given to him yesterday. I told him that nitroglycerin  can cause some head pressure/headache from dilation of blood vessels in the head and contrast can cause some effect as well. It just has to work its way out of his system. As long as he is getting progressively better and not worse, he should be okay. Follow up again if pain is still unrelieved. Patient verbalized understanding. No further needs at this time.

## 2024-09-18 ENCOUNTER — Ambulatory Visit: Payer: Self-pay | Admitting: Cardiology

## 2024-09-18 ENCOUNTER — Other Ambulatory Visit: Payer: Self-pay | Admitting: Internal Medicine

## 2024-09-18 DIAGNOSIS — R1310 Dysphagia, unspecified: Secondary | ICD-10-CM

## 2024-09-18 DIAGNOSIS — E782 Mixed hyperlipidemia: Secondary | ICD-10-CM

## 2024-09-20 ENCOUNTER — Ambulatory Visit: Attending: Cardiology

## 2024-09-20 DIAGNOSIS — R072 Precordial pain: Secondary | ICD-10-CM

## 2024-09-20 LAB — ECHOCARDIOGRAM COMPLETE
AR max vel: 3.76 cm2
AV Area VTI: 3.76 cm2
AV Area mean vel: 3.59 cm2
AV Mean grad: 4 mmHg
AV Peak grad: 6.8 mmHg
Ao pk vel: 1.3 m/s
Area-P 1/2: 4.06 cm2
S' Lateral: 3.3 cm

## 2024-09-22 ENCOUNTER — Ambulatory Visit
Admission: RE | Admit: 2024-09-22 | Discharge: 2024-09-22 | Disposition: A | Discharge status: Home / Self Care | Source: Ambulatory Visit | Attending: Internal Medicine | Admitting: Internal Medicine

## 2024-09-22 DIAGNOSIS — E782 Mixed hyperlipidemia: Secondary | ICD-10-CM | POA: Insufficient documentation

## 2024-09-22 DIAGNOSIS — R1310 Dysphagia, unspecified: Secondary | ICD-10-CM | POA: Diagnosis present

## 2024-09-28 ENCOUNTER — Ambulatory Visit: Admitting: Urology

## 2024-11-24 ENCOUNTER — Ambulatory Visit: Admitting: Cardiology
